# Patient Record
Sex: Female | Born: 1965 | Race: White | Hispanic: No | Smoking: Current every day smoker
Health system: Southern US, Community
[De-identification: ages and names within clinical notes are randomized; demographics above are authoritative.]

## PROBLEM LIST (undated history)

## (undated) DIAGNOSIS — F1010 Alcohol abuse, uncomplicated: Secondary | ICD-10-CM

## (undated) DIAGNOSIS — J45909 Unspecified asthma, uncomplicated: Secondary | ICD-10-CM

## (undated) DIAGNOSIS — M1990 Unspecified osteoarthritis, unspecified site: Secondary | ICD-10-CM

## (undated) DIAGNOSIS — M419 Scoliosis, unspecified: Secondary | ICD-10-CM

## (undated) DIAGNOSIS — I499 Cardiac arrhythmia, unspecified: Secondary | ICD-10-CM

## (undated) DIAGNOSIS — K5900 Constipation, unspecified: Secondary | ICD-10-CM

## (undated) DIAGNOSIS — D649 Anemia, unspecified: Secondary | ICD-10-CM

## (undated) DIAGNOSIS — F419 Anxiety disorder, unspecified: Secondary | ICD-10-CM

## (undated) DIAGNOSIS — M2550 Pain in unspecified joint: Secondary | ICD-10-CM

## (undated) DIAGNOSIS — M47816 Spondylosis without myelopathy or radiculopathy, lumbar region: Secondary | ICD-10-CM

## (undated) DIAGNOSIS — F4310 Post-traumatic stress disorder, unspecified: Secondary | ICD-10-CM

## (undated) DIAGNOSIS — F32A Depression, unspecified: Secondary | ICD-10-CM

## (undated) DIAGNOSIS — N946 Dysmenorrhea, unspecified: Secondary | ICD-10-CM

## (undated) DIAGNOSIS — R200 Anesthesia of skin: Secondary | ICD-10-CM

## (undated) DIAGNOSIS — D219 Benign neoplasm of connective and other soft tissue, unspecified: Secondary | ICD-10-CM

## (undated) DIAGNOSIS — K759 Inflammatory liver disease, unspecified: Secondary | ICD-10-CM

## (undated) DIAGNOSIS — M7071 Other bursitis of hip, right hip: Secondary | ICD-10-CM

## (undated) DIAGNOSIS — N920 Excessive and frequent menstruation with regular cycle: Secondary | ICD-10-CM

## (undated) DIAGNOSIS — M5136 Other intervertebral disc degeneration, lumbar region: Secondary | ICD-10-CM

## (undated) DIAGNOSIS — M7061 Trochanteric bursitis, right hip: Secondary | ICD-10-CM

## (undated) DIAGNOSIS — M51369 Other intervertebral disc degeneration, lumbar region without mention of lumbar back pain or lower extremity pain: Secondary | ICD-10-CM

## (undated) DIAGNOSIS — M810 Age-related osteoporosis without current pathological fracture: Secondary | ICD-10-CM

## (undated) DIAGNOSIS — M797 Fibromyalgia: Secondary | ICD-10-CM

## (undated) HISTORY — PX: CHOLECYSTECTOMY: SHX55

## (undated) HISTORY — DX: Other intervertebral disc degeneration, lumbar region: M51.36

## (undated) HISTORY — PX: NOSE SURGERY: SHX723

## (undated) HISTORY — PX: ULNAR COLLATERAL LIGAMENT RECONSTRUCTION: SHX2593

## (undated) HISTORY — PX: BREAST BIOPSY: SHX20

## (undated) HISTORY — PX: TUBAL LIGATION: SHX77

## (undated) HISTORY — PX: KNEE SURGERY: SHX244

## (undated) HISTORY — PX: TONSILLECTOMY: SUR1361

---

## 2003-08-31 ENCOUNTER — Encounter: Admission: RE | Admit: 2003-08-31 | Discharge: 2003-08-31 | Payer: Self-pay | Admitting: Unknown Physician Specialty

## 2003-08-31 ENCOUNTER — Encounter: Payer: Self-pay | Admitting: Unknown Physician Specialty

## 2007-08-10 ENCOUNTER — Encounter: Payer: Self-pay | Admitting: Obstetrics & Gynecology

## 2007-08-10 ENCOUNTER — Ambulatory Visit: Payer: Self-pay | Admitting: Family Medicine

## 2007-08-11 ENCOUNTER — Encounter: Admission: RE | Admit: 2007-08-11 | Discharge: 2007-08-11 | Payer: Self-pay | Admitting: Family Medicine

## 2007-11-11 HISTORY — PX: ULNAR COLLATERAL LIGAMENT RECONSTRUCTION: SHX2593

## 2007-11-20 ENCOUNTER — Emergency Department: Payer: Self-pay | Admitting: Emergency Medicine

## 2007-12-28 ENCOUNTER — Ambulatory Visit: Payer: Self-pay | Admitting: Unknown Physician Specialty

## 2008-01-09 ENCOUNTER — Ambulatory Visit: Payer: Self-pay | Admitting: Unknown Physician Specialty

## 2008-02-09 ENCOUNTER — Ambulatory Visit: Payer: Self-pay | Admitting: Unknown Physician Specialty

## 2008-03-10 ENCOUNTER — Ambulatory Visit: Payer: Self-pay | Admitting: Unknown Physician Specialty

## 2008-09-18 ENCOUNTER — Encounter: Admission: RE | Admit: 2008-09-18 | Discharge: 2008-09-18 | Payer: Self-pay | Admitting: Family Medicine

## 2008-10-02 ENCOUNTER — Encounter: Admission: RE | Admit: 2008-10-02 | Discharge: 2008-10-02 | Payer: Self-pay | Admitting: Family Medicine

## 2008-10-03 ENCOUNTER — Encounter: Admission: RE | Admit: 2008-10-03 | Discharge: 2008-10-03 | Payer: Self-pay | Admitting: Family Medicine

## 2008-10-03 ENCOUNTER — Encounter (INDEPENDENT_AMBULATORY_CARE_PROVIDER_SITE_OTHER): Payer: Self-pay | Admitting: Diagnostic Radiology

## 2008-11-27 ENCOUNTER — Encounter: Payer: Self-pay | Admitting: Obstetrics and Gynecology

## 2008-11-27 ENCOUNTER — Ambulatory Visit: Payer: Self-pay | Admitting: Obstetrics and Gynecology

## 2010-01-15 ENCOUNTER — Ambulatory Visit: Payer: Self-pay | Admitting: Obstetrics & Gynecology

## 2010-01-21 ENCOUNTER — Encounter: Admission: RE | Admit: 2010-01-21 | Discharge: 2010-01-21 | Payer: Self-pay | Admitting: Obstetrics & Gynecology

## 2010-08-07 ENCOUNTER — Ambulatory Visit: Payer: Self-pay | Admitting: Family Medicine

## 2010-10-10 ENCOUNTER — Ambulatory Visit: Payer: Self-pay | Admitting: Gastroenterology

## 2010-11-08 ENCOUNTER — Ambulatory Visit: Payer: Self-pay | Admitting: Surgery

## 2010-11-15 ENCOUNTER — Ambulatory Visit: Payer: Self-pay | Admitting: Surgery

## 2011-03-25 NOTE — Assessment & Plan Note (Signed)
NAME:  Julia Hoover, KUGEL NO.:  [id number]   MEDICAL RECORD NO.:  [id number]          PATIENT TYPE:  POB   LOCATION:  [facility] at [facility]         FACILITY:  [facility]   PHYSICIAN:  Argentina Donovan, MD        DATE OF BIRTH:  01-29-1966   DATE OF SERVICE:  11/27/2008                                  CLINIC NOTE   The patient is a 45 year old Caucasian female who has been a long-term  smoker, but is in fairly good health.  She has had severe problems with  PMS recently and her periods have become somewhat erratic.  We told her  that we could not oral contraceptives to control the periods and that if  they continued getting worse further on with her perimenopausal  symptoms, she may want to consider endometrial ablation.  We discussed  the PMS for some time, seems to have the symptoms for about 5 days prior  to bleeding episode.  We are going to start on 20 mg of Prozac a day and  see how that works for her.  She also recently remarried a man who is  about 4 years junior and has no children and was thinking about it.  She  has had a previous tubal ligation.  I have told her that almost no one  does the tubal reversals any more and at her age, she may even need a  donor even she wants for in vitro fertilization.  If she is interested,  we will refer her to a consult for IVF in the future.  She is going to  talk to her husband about that.   PHYSICAL EXAMINATION:  ENDOCRINE:  Thyroid is symmetrical.  No masses.  BREASTS:  Symmetrical.  No dominant masses.  She has had her recent  mammogram.  No nipple discharge.  No supraclavicular or axillary nodes.  ABDOMEN:  Soft, nontender.  No masses or organomegaly.  PELVIC:  Genitalia, external is normal.  BUS within normal limits.  Vagina is clean and well rugated.  Cervix is clean, parous, and far  posterior.  Uterus is anterior.  Normal in size, shape, and consistency.  The adnexa is normal.   IMPRESSION:  Normal gynecological  examination, postmenopausal syndrome,  and recent menstrual irregularity.  She has a history of depression in  the past.           ______________________________  Argentina Donovan, MD     PR/MEDQ  D:  11/27/2008  T:  11/28/2008  Job:  [id number]

## 2011-03-25 NOTE — Assessment & Plan Note (Signed)
NAME:  Julia Hoover, Julia Hoover               ACCOUNT NO.:  [id number]   MEDICAL RECORD NO.:  [id number]          PATIENT TYPE:  POB   LOCATION:  [facility]         FACILITY:  [facility]   PHYSICIAN:  Scheryl Darter, MD       DATE OF BIRTH:  1966-05-12   DATE OF SERVICE:                                  CLINIC NOTE   The patient comes today for physical exam.  She has had breast pain for  about the last 4 weeks on the left side.  No nipple discharge or any  skin changes.  She has had a breast cyst on the right, which was  treated.  Last menstrual period was February 2011.  She has regular  menses.  She is status post tubal ligation.   PAST MEDICAL HISTORY:  1. Breast cyst.  2. Previous history of alcohol abuse.  3. Degenerative disk disease.   FAMILY HISTORY:  Hypertension, thyroid disorder, benign breast cyst, and  arthritis.   SOCIAL HISTORY:  The patient is married and smokes 1 pack of cigarettes  a day.  She denies alcohol use or drug use.   PAST SURGICAL HISTORY:  Tubal ligation.   MEDICATIONS:  Tramadol 50 mg one half tablets a day.   REVIEW OF SYSTEMS:  Breast pain as noted.  No problems with bowel,  bladder, or menstrual complaints.   PHYSICAL EXAMINATION:  GENERAL:  No acute distress.  VITAL SIGNS:  Weight 128 pounds, height 5 feet 2 inches, blood pressure  114/65, pulse 66.  CHEST:  Clear.  HEART:  Regular rate and rhythm.  No thyromegaly.  BREASTS:  Appear symmetric.  No nipple or skin changes.  No axillary  lymphadenopathy.  Right breast is without masses or tenderness.  Left  breast is tender in the medial lower quadrant, but no discrete mass.  ABDOMEN:  Flat, soft, and nontender.  EXTREMITIES:  No swelling.  PELVIC:  External genitalia, vagina, and cervix appeared normal.  Pap  was done.  Uterus normal size.  No adnexal masses or tenderness.   IMPRESSION:  Left breast pain.   PLAN:  Make a referral for diagnostic mammogram and ultrasound as  needed.  She will  return in 1 month to review her progress.  We  recommended smoking cessation.  She will speak to her primary care  physician about her back pain and whether she needs to see  rheumatologist.      Scheryl Darter, MD     JA/MEDQ  D:  01/15/2010  T:  01/16/2010  Job:  [id number]

## 2011-03-25 NOTE — Assessment & Plan Note (Signed)
NAME:  DOCIE, ABRAMOVICH             ACCOUNT NO.:  [id number]   MEDICAL RECORD NO.:  [id number]          PATIENT TYPE:  POB   LOCATION:  [facility]         FACILITY:  [facility]   PHYSICIAN:  Tinnie Gens, MD        DATE OF BIRTH:  05-07-1966   DATE OF SERVICE:                                  CLINIC NOTE   Julia Hoover is a 45 year old white female who presents today for her GYN  exam.   PAST MEDICAL HISTORY:  Significant for hepatitis as a child.  Treatment,  she states, was bedrest for 6 weeks.  She does not know whether it is A  or B.  Joint pain x10 years which is being followed by her medical  doctor in [place], Dr. Rulon Abide, who has referred her to a  rheumatologist at the [facility].  Her appointment is August 31, 2007.   FAMILY HISTORY:  Significant her mother has hypertension, thyroid  disorder, tendinitis, and arthritis.  Her brother has polychondritis.   SOCIAL HISTORY:  Ms. Mira smokes 1 pack a day and has for many years.  She has recently stopped drinking alcohol and denies recreational drug  use.  She works in an [employer] and is required to do heavy  lifting.   GYN HISTORY:  Denies abnormal genital cytology.  She is gravida 3, AB 1,  para 2.  Her children are now ages 59 and 58 years old.  She is sexually  active with one partner.   REVIEW OF SYSTEMS:  Significant for joint pain, which has been treated  with cortisone injections.  She also states that she has had headaches  for 1 month lasting about 2 days.  Workup for these headaches is planned  with her medical doctor.  She reports extreme breast tenderness and mood  swings 1 week prior to her period.  These symptoms are completely  resolved with the onset of her cycle.  She denies breast, urinary tract,  or vaginal symptoms today.   ALLERGIES:  No known drug allergies.  It is noted that she has a LATEX  allergy.   The first day of her last period was August 01, 2007.   CURRENT  MEDICATIONS:  1. Seroquel 50 mg at bedtime.  2. Tramadol 50 mg daily.  3. Occasionally she will use ibuprofen 800 mg for joint pain.   PHYSICAL EXAM:  VITAL SIGNS:  Pulse 66, blood pressure 129/72, weight  135.  Height is 5 feet 2 inches.  Head, ears, nose, nose and throat without thyroid enlargement.  BREASTS:  Bilaterally soft without masses, nodes, or nipple discharge.  Heart rate and rhythm are regular without murmur, gallop or cardiac  enlargement.  LUNGS:  Bilaterally clear.  ABDOMEN:  Soft and scaphoid without masses or organomegaly.  PELVIC EXAM:  External genitalia within normal limits for female.  Vagina is clean and rugose.  Cervix is parous and clean.  The uterus is  normal shape, size, and contour without tenderness.  Adnexa bilaterally  clear.  Rectovaginal exam confirms.  EXTREMITIES:  Within normal limits with the exception of tenderness over  the joints, especially her elbow.   ASSESSMENT:  1. Normal gynecologic exam.  2. Premenstrual syndrome.  3. Joint pain followed by her medical doctor.   PLAN:  1. Mammogram was scheduled with reports being sent to [place].  2. Pap smear was taken today.  Results 1-2 weeks.  3. The patient plans to stop smoking and may need some help with      anxiety related to that.  She has taken Xanax in the past and may      call for a new prescription at that time.  If she needs Xanax more      often than we feel is safe, we will consider changing her to      Wellbutrin.  4. She was encouraged to do self-breast exams monthly.  She will have      general health labs done with Dr. Rulon Abide.     ______________________________  Matt Holmes, N.P.    ______________________________  Tinnie Gens, MD    EMK/MEDQ  D:  08/10/2007  T:  08/10/2007  Job:  [id number]

## 2011-04-24 ENCOUNTER — Other Ambulatory Visit: Payer: Self-pay | Admitting: Family Medicine

## 2011-04-24 DIAGNOSIS — Z1231 Encounter for screening mammogram for malignant neoplasm of breast: Secondary | ICD-10-CM

## 2011-05-06 ENCOUNTER — Ambulatory Visit: Payer: Self-pay

## 2011-11-11 HISTORY — PX: BREAST CYST ASPIRATION: SHX578

## 2012-06-03 ENCOUNTER — Ambulatory Visit: Payer: Self-pay | Admitting: Family Medicine

## 2014-03-20 ENCOUNTER — Ambulatory Visit: Payer: Self-pay | Admitting: Orthopedic Surgery

## 2014-03-31 ENCOUNTER — Ambulatory Visit (INDEPENDENT_AMBULATORY_CARE_PROVIDER_SITE_OTHER): Payer: BC Managed Care – PPO | Admitting: Podiatry

## 2014-03-31 ENCOUNTER — Ambulatory Visit (INDEPENDENT_AMBULATORY_CARE_PROVIDER_SITE_OTHER): Payer: BC Managed Care – PPO

## 2014-03-31 VITALS — BP 114/68 | HR 69 | Resp 16

## 2014-03-31 DIAGNOSIS — M779 Enthesopathy, unspecified: Secondary | ICD-10-CM

## 2014-03-31 DIAGNOSIS — M2040 Other hammer toe(s) (acquired), unspecified foot: Secondary | ICD-10-CM

## 2014-03-31 MED ORDER — TRIAMCINOLONE ACETONIDE 10 MG/ML IJ SUSP
10.0000 mg | Freq: Once | INTRAMUSCULAR | Status: AC
  Administered 2014-03-31: 10 mg

## 2014-03-31 NOTE — Progress Notes (Signed)
Subjective:     Patient ID: Julia Hoover, female   DOB: [birth year], 48 y.o.   MRN: [id number]  HPI patient presents stating that her left foot has started to do the same as her right foot and the toe is starting to drift on that foot and she's getting pain  Review of Systems     Objective:   Physical Exam Neurovascular status intact with no health history changes noted and discomfort in the second metatarsal phalangeal joint with medial drift of the second toe left. The right remains drift and also towards the big toe and there is mild discomfort in that 1    Assessment:     Probable flexor plate stretch creating inflammation with capsulitis occurring which first occurred in the right now the left    Plan:     Reviewed condition and x-ray with patient. I went ahead and a proximal nerve block and then aspirated the joint and was able to get up a small amount of clear fluid and injected with a quarter cc of dexamethasone Kenalog combination. Explained the condition and hammertoe and that someday the digit may need to be corrected with metatarsal osteotomy. Today we did scanned for orthotics which she will pick up in several weeks

## 2014-04-02 ENCOUNTER — Ambulatory Visit: Payer: Self-pay

## 2014-04-02 LAB — CBC WITH DIFFERENTIAL/PLATELET
BASOS PCT: 0.6 %
Basophil #: 0 10*3/uL (ref 0.0–0.1)
EOS PCT: 1.6 %
Eosinophil #: 0.1 10*3/uL (ref 0.0–0.7)
HCT: 39.9 % (ref 35.0–47.0)
HGB: 13.1 g/dL (ref 12.0–16.0)
LYMPHS PCT: 31.5 %
Lymphocyte #: 2.3 10*3/uL (ref 1.0–3.6)
MCH: 30.5 pg (ref 26.0–34.0)
MCHC: 32.8 g/dL (ref 32.0–36.0)
MCV: 93 fL (ref 80–100)
Monocyte #: 0.5 x10 3/mm (ref 0.2–0.9)
Monocyte %: 6.8 %
Neutrophil #: 4.4 10*3/uL (ref 1.4–6.5)
Neutrophil %: 59.5 %
Platelet: 248 10*3/uL (ref 150–440)
RBC: 4.29 10*6/uL (ref 3.80–5.20)
RDW: 13.7 % (ref 11.5–14.5)
WBC: 7.4 10*3/uL (ref 3.6–11.0)

## 2014-04-12 ENCOUNTER — Encounter: Payer: Self-pay | Admitting: *Deleted

## 2014-04-12 NOTE — Progress Notes (Signed)
Sent pt post card letting her know orthotics are here. 

## 2014-04-21 ENCOUNTER — Ambulatory Visit (INDEPENDENT_AMBULATORY_CARE_PROVIDER_SITE_OTHER): Payer: BC Managed Care – PPO | Admitting: Podiatry

## 2014-04-21 VITALS — BP 115/80 | HR 66 | Resp 16

## 2014-04-21 DIAGNOSIS — M779 Enthesopathy, unspecified: Secondary | ICD-10-CM

## 2014-04-21 DIAGNOSIS — B351 Tinea unguium: Secondary | ICD-10-CM

## 2014-04-21 NOTE — Progress Notes (Signed)
Subjective:     Patient ID: Julia Hoover, female   DOB: 1966/06/30, 48 y.o.   MRN: [id number]  HPI patient was concerned about fungus on her right nails and also does have mild discomfort still under her feet   Review of Systems     Objective:   Physical Exam Neurovascular status unchanged with slight discoloration of the hallux nail and second nail right with mild discomfort plantar feet    Assessment:     Mild mycotic nail infection along with low grade inflammation of the lesser MPJs    Plan:     Reviewed condition and topical treatments and begin orthotic usage with instructions given today

## 2014-04-21 NOTE — Patient Instructions (Signed)

## 2014-06-02 ENCOUNTER — Ambulatory Visit (INDEPENDENT_AMBULATORY_CARE_PROVIDER_SITE_OTHER): Payer: BC Managed Care – PPO | Admitting: Podiatry

## 2014-06-02 ENCOUNTER — Ambulatory Visit (INDEPENDENT_AMBULATORY_CARE_PROVIDER_SITE_OTHER): Payer: BC Managed Care – PPO

## 2014-06-02 ENCOUNTER — Encounter: Payer: Self-pay | Admitting: Podiatry

## 2014-06-02 VITALS — Ht 62.0 in | Wt 130.0 lb

## 2014-06-02 DIAGNOSIS — M722 Plantar fascial fibromatosis: Secondary | ICD-10-CM

## 2014-06-02 DIAGNOSIS — M779 Enthesopathy, unspecified: Secondary | ICD-10-CM

## 2014-06-02 DIAGNOSIS — M2040 Other hammer toe(s) (acquired), unspecified foot: Secondary | ICD-10-CM

## 2014-06-02 MED ORDER — DICLOFENAC SODIUM 75 MG PO TBEC: 75.0000 mg | DELAYED_RELEASE_TABLET | Freq: Two times a day (BID) | ORAL | Status: DC

## 2014-06-03 NOTE — Progress Notes (Signed)
Subjective:     Patient ID: Julia Hoover, female   DOB: 12-04-1965, 48 y.o.   MRN: [id number]  HPI patient states that her heels are hurting again but that the worse pain is in the left second metatarsophalangeal joint with possible further movement of the toe and inability to walk on that side of her foot. States that she knows someday she might have did have something done with this but she is depressed at the current time and would like to try to hold off on doing some thing   Review of Systems     Objective:   Physical Exam Neurovascular status was intact with inflammation and pain second MPJ left foot and mild discomfort in the plantar heels of both feet    Assessment:     Probable flexor plate stretch of the left second MPJ with chronic capsulitis made worse by working on cement floors    Plan:     H&P reviewed and condition discussed. I went ahead today and I applied air fracture walker left to provide rigidity to the area and dispensed graphite insole. We may need to consider surgery and will decide in 1 month and I also placed her on diclofenac 75 mg twice a day

## 2014-06-30 ENCOUNTER — Ambulatory Visit: Payer: BC Managed Care – PPO | Admitting: Podiatry

## 2014-08-26 ENCOUNTER — Ambulatory Visit: Payer: Self-pay

## 2014-12-27 ENCOUNTER — Ambulatory Visit: Payer: Self-pay | Admitting: Emergency Medicine

## 2015-04-19 ENCOUNTER — Ambulatory Visit
Admission: EM | Admit: 2015-04-19 | Discharge: 2015-04-19 | Disposition: A | Discharge status: Home / Self Care | Payer: BLUE CROSS/BLUE SHIELD | Attending: Family Medicine | Admitting: Family Medicine

## 2015-04-19 DIAGNOSIS — F419 Anxiety disorder, unspecified: Secondary | ICD-10-CM | POA: Diagnosis not present

## 2015-04-19 HISTORY — DX: Unspecified osteoarthritis, unspecified site: M19.90

## 2015-04-19 HISTORY — DX: Spondylosis without myelopathy or radiculopathy, lumbar region: M47.816

## 2015-04-19 HISTORY — DX: Fibromyalgia: M79.7

## 2015-04-19 HISTORY — DX: Anxiety disorder, unspecified: F41.9

## 2015-04-19 MED ORDER — CLONAZEPAM 1 MG PO TABS: ORAL_TABLET | ORAL | Status: DC

## 2015-04-19 NOTE — ED Provider Notes (Signed)
CSN: [id number]     Arrival date & time 04/19/15  1833 History   First MD Initiated Contact with Patient 04/19/15 1906     Chief Complaint  Patient presents with  . Panic Attack   (Consider location/radiation/quality/duration/timing/severity/associated sxs/prior Treatment) HPI Comments: 49 yo female with a h/o anxiety presents with a h/o panic attack associated with heart racing, sweaty palms, flushed. Happened at work around 5:30.   The history is provided by the patient.    Past Medical History  Diagnosis Date  . Arthritis   . Fibromyalgia   . Anxiety   . Degenerative joint disease (DJD) of lumbar spine    Past Surgical History  Procedure Laterality Date  . Ulnar collateral ligament reconstruction    . Tonsillectomy    . Tubal ligation    . Cholecystectomy    . Nose surgery     No family history on file. History  Substance Use Topics  . Smoking status: Former Smoker -- 1.00 packs/day for 32 years    Types: Cigarettes    Quit date: 11/09/2012  . Smokeless tobacco: Never Used  . Alcohol Use: No     Comment: former alcohol use   OB History    No data available     Review of Systems  Allergies  Review of patient's allergies indicates no known allergies.  Home Medications   Prior to Admission medications   Medication Sig Start Date End Date Taking? Authorizing Provider  methocarbamol (ROBAXIN) 500 MG tablet Take 500 mg by mouth 2 (two) times daily.   Yes Historical Provider, MD  traMADol (ULTRAM) 50 MG tablet Take by mouth every 6 (six) hours as needed.   Yes Historical Provider, MD  traZODone (DESYREL) 50 MG tablet Take 25 mg by mouth at bedtime.   Yes Historical Provider, MD  zaleplon (SONATA) 10 MG capsule Take 10 mg by mouth at bedtime as needed for sleep.   Yes Historical Provider, MD  clonazePAM (KLONOPIN) 1 MG tablet Take half to one tablet po qd prn 04/19/15   Norval Gable, MD  diclofenac (VOLTAREN) 75 MG EC tablet Take 1 tablet (75 mg total) by mouth 2  (two) times daily. 06/02/14   Wallene Huh, DPM   BP 113/69 mmHg  Pulse 97  Temp(Src) 97.8 F (36.6 C) (Oral)  Resp 16  Ht 5\' 2"  (1.575 m)  Wt 137 lb (62.143 kg)  BMI 25.05 kg/m2  SpO2 98%  LMP 03/22/2015 (Exact Date) Physical Exam  Constitutional: She is oriented to person, place, and time. She appears well-developed and well-nourished. No distress.  Pulmonary/Chest: Effort normal. No respiratory distress.  Neurological: She is alert and oriented to person, place, and time.  Skin: She is not diaphoretic.  Psychiatric: She has a normal mood and affect. Her behavior is normal. Judgment and thought content normal.    ED Course  Procedures (including critical care time) Labs Review Labs Reviewed - No data to display  Imaging Review No results found.   MDM   1. Anxiety    Discharge Medication List as of 04/19/2015  7:32 PM    START taking these medications   Details  clonazePAM (KLONOPIN) 1 MG tablet Take half to one tablet po qd prn, Print      Plan: 1. Diagnosis reviewed with patient 2. rx as per orders; risks, benefits, potential side effects reviewed with patient 3. Recommend follow up with PCP 4. F/u prn if symptoms worsen or don't improve    Julia Hoover  Zenda Alpers, MD 04/19/15 2011

## 2015-04-19 NOTE — ED Notes (Signed)
Pt reports she had a panic attack at work today around 5:30. Pt states she cried, had a racing heart, sweaty palms, flushed feeling. Pt states she had to leave work. Pt reports that her anxiety has been worsening now that she is menopausal. Pt has been on Klonopin for this in the past, about 2 years ago.

## 2015-06-12 ENCOUNTER — Ambulatory Visit
Admission: EM | Admit: 2015-06-12 | Discharge: 2015-06-12 | Disposition: A | Discharge status: Home / Self Care | Payer: BLUE CROSS/BLUE SHIELD | Attending: Family Medicine | Admitting: Family Medicine

## 2015-06-12 DIAGNOSIS — R197 Diarrhea, unspecified: Secondary | ICD-10-CM

## 2015-06-12 DIAGNOSIS — M797 Fibromyalgia: Secondary | ICD-10-CM | POA: Diagnosis not present

## 2015-06-12 DIAGNOSIS — F419 Anxiety disorder, unspecified: Secondary | ICD-10-CM | POA: Insufficient documentation

## 2015-06-12 DIAGNOSIS — K921 Melena: Secondary | ICD-10-CM | POA: Insufficient documentation

## 2015-06-12 DIAGNOSIS — Z87891 Personal history of nicotine dependence: Secondary | ICD-10-CM | POA: Insufficient documentation

## 2015-06-12 DIAGNOSIS — Z79899 Other long term (current) drug therapy: Secondary | ICD-10-CM | POA: Diagnosis not present

## 2015-06-12 DIAGNOSIS — M47896 Other spondylosis, lumbar region: Secondary | ICD-10-CM | POA: Insufficient documentation

## 2015-06-12 HISTORY — DX: Dysmenorrhea, unspecified: N94.6

## 2015-06-12 HISTORY — DX: Benign neoplasm of connective and other soft tissue, unspecified: D21.9

## 2015-06-12 HISTORY — DX: Excessive and frequent menstruation with regular cycle: N92.0

## 2015-06-12 LAB — CBC WITH DIFFERENTIAL/PLATELET
BASOS PCT: 1 %
Basophils Absolute: 0 10*3/uL (ref 0–0.1)
EOS ABS: 0 10*3/uL (ref 0–0.7)
EOS PCT: 0 %
HEMATOCRIT: 35.2 % (ref 35.0–47.0)
HEMOGLOBIN: 11.7 g/dL — AB (ref 12.0–16.0)
LYMPHS PCT: 24 %
Lymphs Abs: 1.4 10*3/uL (ref 1.0–3.6)
MCH: 29.9 pg (ref 26.0–34.0)
MCHC: 33.3 g/dL (ref 32.0–36.0)
MCV: 90 fL (ref 80.0–100.0)
MONOS PCT: 7 %
Monocytes Absolute: 0.4 10*3/uL (ref 0.2–0.9)
NEUTROS PCT: 68 %
Neutro Abs: 4 10*3/uL (ref 1.4–6.5)
PLATELETS: 220 10*3/uL (ref 150–440)
RBC: 3.91 MIL/uL (ref 3.80–5.20)
RDW: 13.9 % (ref 11.5–14.5)
WBC: 5.8 10*3/uL (ref 3.6–11.0)

## 2015-06-12 LAB — COMPREHENSIVE METABOLIC PANEL
ALK PHOS: 66 U/L (ref 38–126)
ALT: 16 U/L (ref 14–54)
ANION GAP: 6 (ref 5–15)
AST: 24 U/L (ref 15–41)
Albumin: 4 g/dL (ref 3.5–5.0)
BUN: 13 mg/dL (ref 6–20)
CALCIUM: 9.1 mg/dL (ref 8.9–10.3)
CHLORIDE: 106 mmol/L (ref 101–111)
CO2: 26 mmol/L (ref 22–32)
CREATININE: 0.74 mg/dL (ref 0.44–1.00)
GFR calc Af Amer: >60 mL/min (ref >60)
GLUCOSE: 105 mg/dL — AB (ref 65–99)
Potassium: 3.9 mmol/L (ref 3.5–5.1)
Sodium: 138 mmol/L (ref 135–145)
TOTAL PROTEIN: 6.8 g/dL (ref 6.5–8.1)
Total Bilirubin: 0.2 mg/dL — ABNORMAL LOW (ref 0.3–1.2)

## 2015-06-12 LAB — OCCULT BLOOD X 1 CARD TO LAB, STOOL: Fecal Occult Bld: NEGATIVE

## 2015-06-12 NOTE — Discharge Instructions (Signed)
Please schedule your colonoscopy ! Please reduce your sodas and increase your water ... Daily prunes :)  High-Fiber Diet Fiber is found in fruits, vegetables, and grains. A high-fiber diet encourages the addition of more whole grains, legumes, fruits, and vegetables in your diet. The recommended amount of fiber for adult males is 38 g per day. For adult females, it is 25 g per day. Pregnant and lactating women should get 28 g of fiber per day. If you have a digestive or bowel problem, ask your caregiver for advice before adding high-fiber foods to your diet. Eat a variety of high-fiber foods instead of only a select few type of foods.  PURPOSE  To increase stool bulk.  To make bowel movements more regular to prevent constipation.  To lower cholesterol.  To prevent overeating. WHEN IS THIS DIET USED?  It may be used if you have constipation and hemorrhoids.  It may be used if you have uncomplicated diverticulosis (intestine condition) and irritable bowel syndrome.  It may be used if you need help with weight management.  It may be used if you want to add it to your diet as a protective measure against atherosclerosis, diabetes, and cancer. SOURCES OF FIBER  Whole-grain breads and cereals.  Fruits, such as apples, oranges, bananas, berries, prunes, and pears.  Vegetables, such as green peas, carrots, sweet potatoes, beets, broccoli, cabbage, spinach, and artichokes.  Legumes, such split peas, soy, lentils.  Almonds. FIBER CONTENT IN FOODS Starches and Grains / Dietary Fiber (g)  Cheerios, 1 cup / 3 g  Corn Flakes cereal, 1 cup / 0.7 g  Rice crispy treat cereal, 1 cup / 0.3 g  Instant oatmeal (cooked),  cup / 2 g  Frosted wheat cereal, 1 cup / 5.1 g  Brown, long-grain rice (cooked), 1 cup / 3.5 g  White, long-grain rice (cooked), 1 cup / 0.6 g  Enriched macaroni (cooked), 1 cup / 2.5 g Legumes / Dietary Fiber (g)  Baked beans (canned, plain, or vegetarian),   cup / 5.2 g  Kidney beans (canned),  cup / 6.8 g  Pinto beans (cooked),  cup / 5.5 g Breads and Crackers / Dietary Fiber (g)  Plain or honey graham crackers, 2 squares / 0.7 g  Saltine crackers, 3 squares / 0.3 g  Plain, salted pretzels, 10 pieces / 1.8 g  Whole-wheat bread, 1 slice / 1.9 g  White bread, 1 slice / 0.7 g  Raisin bread, 1 slice / 1.2 g  Plain bagel, 3 oz / 2 g  Flour tortilla, 1 oz / 0.9 g  Corn tortilla, 1 small / 1.5 g  Hamburger or hotdog bun, 1 small / 0.9 g Fruits / Dietary Fiber (g)  Apple with skin, 1 medium / 4.4 g  Sweetened applesauce,  cup / 1.5 g  Banana,  medium / 1.5 g  Grapes, 10 grapes / 0.4 g  Orange, 1 small / 2.3 g  Raisin, 1.5 oz / 1.6 g  Melon, 1 cup / 1.4 g Vegetables / Dietary Fiber (g)  Green beans (canned),  cup / 1.3 g  Carrots (cooked),  cup / 2.3 g  Broccoli (cooked),  cup / 2.8 g  Peas (cooked),  cup / 4.4 g  Mashed potatoes,  cup / 1.6 g  Lettuce, 1 cup / 0.5 g  Corn (canned),  cup / 1.6 g  Tomato,  cup / 1.1 g Document Released: 10/27/2005 Document Revised: 04/27/2012 Document Reviewed: 01/29/2012 Exit[employer] Patient Information 2015  [employer]. This information is not intended to replace advice given to you by your health care provider. Make sure you discuss any questions you have with your health care provider. Iron Deficiency Anemia Anemia is a condition in which there are less red blood cells or hemoglobin in the blood than normal. Hemoglobin is the part of red blood cells that carries oxygen. Iron deficiency anemia is anemia caused by too little iron. It is the most common type of anemia. It may leave you tired and short of breath. CAUSES   Lack of iron in the diet.  Poor absorption of iron, as seen with intestinal disorders.  Intestinal bleeding.  Heavy periods. SIGNS AND SYMPTOMS  Mild anemia may not be noticeable. Symptoms may include:  Fatigue.  Headache.  Pale  skin.  Weakness.  Tiredness.  Shortness of breath.  Dizziness.  Cold hands and feet.  Fast or irregular heartbeat. DIAGNOSIS  Diagnosis requires a thorough evaluation and physical exam by your health care provider. Blood tests are generally used to confirm iron deficiency anemia. Additional tests may be done to find the underlying cause of your anemia. These may include:  Testing for blood in the stool (fecal occult blood test).  A procedure to see inside the colon and rectum (colonoscopy).  A procedure to see inside the esophagus and stomach (endoscopy). TREATMENT  Iron deficiency anemia is treated by correcting the cause of the deficiency. Treatment may involve:  Adding iron-rich foods to your diet.  Taking iron supplements. Pregnant or breastfeeding women need to take extra iron because their normal diet usually does not provide the required amount.  Taking vitamins. Vitamin C improves the absorption of iron. Your health care provider may recommend that you take your iron tablets with a glass of orange juice or vitamin C supplement.  Medicines to make heavy menstrual flow lighter.  Surgery. HOME CARE INSTRUCTIONS   Take iron as directed by your health care provider.  If you cannot tolerate taking iron supplements by mouth, talk to your health care provider about taking them through a vein (intravenously) or an injection into a muscle.  For the best iron absorption, iron supplements should be taken on an empty stomach. If you cannot tolerate them on an empty stomach, you may need to take them with food.  Do not drink milk or take antacids at the same time as your iron supplements. Milk and antacids may interfere with the absorption of iron.  Iron supplements can cause constipation. Make sure to include fiber in your diet to prevent constipation. A stool softener may also be recommended.  Take vitamins as directed by your health care provider.  Eat a diet rich in iron.  Foods high in iron include liver, lean beef, whole-grain bread, eggs, dried fruit, and dark green leafy vegetables. SEEK IMMEDIATE MEDICAL CARE IF:   You faint. If this happens, do not drive. Call your local emergency services (911 in [place] if no other help is available.  You have chest pain.  You feel nauseous or vomit.  You have severe or increased shortness of breath with activity.  You feel weak.  You have a rapid heartbeat.  You have unexplained sweating.  You become light-headed when getting up from a chair or bed. MAKE SURE YOU:   Understand these instructions.  Will watch your condition.  Will get help right away if you are not doing well or get worse. Document Released: 10/24/2000 Document Revised: 11/01/2013 Document Reviewed: 07/04/2013 ExitCare Patient Information  2015 [employer]. This information is not intended to replace advice given to you by your health care provider. Make sure you discuss any questions you have with your health care provider.

## 2015-06-12 NOTE — ED Provider Notes (Signed)
CSN: [id number]     Arrival date & time 06/12/15  1520 History   First MD Initiated Contact with Patient 06/12/15 1556     Chief Complaint  Patient presents with  . Blood In Stools   (Consider location/radiation/quality/duration/timing/severity/associated sxs/prior Treatment) HPI 49 yo F reports episode of diarrhea 3 weeks ago after visiting [place] , ceased spontaneously. Spent 4th of July at the beach, 3 of the family children had Hand, Foot, Mouth-but she did not contract.  Spent this past weekend at the beach - reports heavy ETOH intake- Had 3 episodes of diarrhea during last night and  this morning. Believes that it "wasn't stool but was bright red blood' Began her menses in last 2 days but is sure she wasn't seeing menstrual blood in toilet bowl. Never had coffee ground emesis. Convinced she has "hemorrhoids or a fissure or bowel bleed".  Commonly constipated ( since I was 20)and uses laxatives and enemas. Loves prunes but forgets to eat them Reports known diverticulosis, has had an episode of "itis" and was counseled to schedule a colonoscopy but has delayed. Had a BTL at age 32. Has had increasingly heavy menses for years,fibroids and pelvic pain.  Has not had a Gyn visit in years.  Past Medical History  Diagnosis Date  . Arthritis   . Fibromyalgia   . Anxiety   . Degenerative joint disease (DJD) of lumbar spine   . Fibroids   . Menorrhagia   . Dysmenorrhea    Past Surgical History  Procedure Laterality Date  . Ulnar collateral ligament reconstruction    . Tonsillectomy    . Tubal ligation    . Cholecystectomy    . Nose surgery     History reviewed. No pertinent family history. History  Substance Use Topics  . Smoking status: Former Smoker -- 1.00 packs/day for 32 years    Types: Cigarettes    Quit date: 11/09/2012  . Smokeless tobacco: Never Used  . Alcohol Use: 0.0 oz/week    0 Standard drinks or equivalent per week     Comment: occasional weekday; party  weekends   OB History    No data available     Review of Systems Constitutional: No fever.  Eyes: No visual changes. ENT:No sore throat. Cardiovascular:Negative for chest pain/palpitations Respiratory: Negative for shortness of breath Gastrointestinal: No abdominal pain. No nausea,vomiting. Diarrhea per HPI  Genitourinary: Negative for dysuria. Normal urination. Musculoskeletal: Negative for back pain. FROM extremities without pain Skin: Negative for rash Neurological: Negative for headache, focal weakness or numbness Allergies  Review of patient's allergies indicates no known allergies.  Home Medications   Prior to Admission medications   Medication Sig Start Date End Date Taking? Authorizing Provider  clonazePAM (KLONOPIN) 1 MG tablet Take half to one tablet po qd prn 04/19/15  Yes Norval Gable, MD  escitalopram (LEXAPRO) 20 MG tablet Take 20 mg by mouth daily.   Yes Historical Provider, MD  Melatonin 1 MG CAPS Take by mouth.   Yes Historical Provider, MD  methocarbamol (ROBAXIN) 500 MG tablet Take 500 mg by mouth 2 (two) times daily.   Yes Historical Provider, MD  traMADol (ULTRAM) 50 MG tablet Take by mouth every 6 (six) hours as needed.   Yes Historical Provider, MD  traZODone (DESYREL) 50 MG tablet Take 25 mg by mouth at bedtime.   Yes Historical Provider, MD  diclofenac (VOLTAREN) 75 MG EC tablet Take 1 tablet (75 mg total) by mouth 2 (two) times daily. 06/02/14  Wallene Huh, DPM  zaleplon (SONATA) 10 MG capsule Take 10 mg by mouth at bedtime as needed for sleep.    Historical Provider, MD   BP 120/73 mmHg  Pulse 78  Temp(Src) 97.5 F (36.4 C) (Tympanic)  Resp 18  Ht 5\' 2"  (1.575 m)  Wt 138 lb (62.596 kg)  BMI 25.23 kg/m2  SpO2 100%  LMP 06/12/2015 Physical Exam   Constitutional -alert and oriented,well appearing and in no acute distress, VSS Head-atraumatic, normocephalic Eyes- conjunctiva normal, EOMI ,conjugate gaze Nose- no congestion or  rhinorrhea Mouth/throat- mucous membranes moist , Neck- supple without glandular enlargement CV- regular rate, grossly normal heart sounds,  Resp-no distress, normal respiratory effort,clear to auscultation bilaterally GI- soft,non-tender,no distention Rectal exam- no hemorrhoids, no fissure identified, rectum empty- vaginal  tampon palpated through RV; Guiac negative MSK- no tender, normal ROM, all extremities, ambulatory, self-care Neuro- normal speech and language, no gross focal neurological deficit appreciated, no gait instability, Skin-warm,dry ,intact; no rash noted Psych-mood and affect grossly normal; speech and behavior grossly normal ED Course  Procedures (including critical care time) Labs Review Labs Reviewed  CBC WITH DIFFERENTIAL/PLATELET - Abnormal; Notable for the following:    Hemoglobin 11.7 (*)    All other components within normal limits  COMPREHENSIVE METABOLIC PANEL - Abnormal; Notable for the following:    Glucose, Bld 105 (*)    Total Bilirubin 0.2 (*)    All other components within normal limits  OCCULT BLOOD X 1 CARD TO LAB, STOOL   Results for orders placed or performed during the hospital encounter of 06/12/15  Occult blood card to lab, stool Provider will collect  Result Value Ref Range   Fecal Occult Bld NEGATIVE NEGATIVE  CBC with Differential  Result Value Ref Range   WBC 5.8 3.6 - 11.0 K/uL   RBC 3.91 3.80 - 5.20 MIL/uL   Hemoglobin 11.7 (L) 12.0 - 16.0 g/dL   HCT 35.2 35.0 - 47.0 %   MCV 90.0 80.0 - 100.0 fL   MCH 29.9 26.0 - 34.0 pg   MCHC 33.3 32.0 - 36.0 g/dL   RDW 13.9 11.5 - 14.5 %   Platelets 220 150 - 440 K/uL   Neutrophils Relative % 68 %   Neutro Abs 4.0 1.4 - 6.5 K/uL   Lymphocytes Relative 24 %   Lymphs Abs 1.4 1.0 - 3.6 K/uL   Monocytes Relative 7 %   Monocytes Absolute 0.4 0.2 - 0.9 K/uL   Eosinophils Relative 0 %   Eosinophils Absolute 0.0 0 - 0.7 K/uL   Basophils Relative 1 %   Basophils Absolute 0.0 0 - 0.1 K/uL   Comprehensive metabolic panel  Result Value Ref Range   Sodium 138 135 - 145 mmol/L   Potassium 3.9 3.5 - 5.1 mmol/L   Chloride 106 101 - 111 mmol/L   CO2 26 22 - 32 mmol/L   Glucose, Bld 105 (H) 65 - 99 mg/dL   BUN 13 6 - 20 mg/dL   Creatinine, Ser 0.74 0.44 - 1.00 mg/dL   Calcium 9.1 8.9 - 10.3 mg/dL   Total Protein 6.8 6.5 - 8.1 g/dL   Albumin 4.0 3.5 - 5.0 g/dL   AST 24 15 - 41 U/L   ALT 16 14 - 54 U/L   Alkaline Phosphatase 66 38 - 126 U/L   Total Bilirubin 0.2 (L) 0.3 - 1.2 mg/dL   GFR calc non Af Amer >60 >60 mL/min   GFR calc Af Amer >60 >60  mL/min   Anion gap 6 5 - 15    Imaging Review No results found.  Patient was unable to obtain any stool specimen while present in UC. She is given specimen containers for CDiff, O&P and Culture to return tomorrow MDM   Negative physical and lab findings are reviewed with patient. She is anxious about finding something and about missing something.  Encourage an increased fiber diet, stop sods and add water.  Stool softening with prunes and daily walks encouraged- not to allow multiple days and resultant constipation with is irritating.  Now reports very large single stool last evening followed by the diarrhea episode. Encouraged to call and accomplish her screening colonoscopy Also schedule GYN visit. Her blood count is essentially normal but cycles reported as heavy and uncomfortable-seek consultation. Diagnosis and treatment discussed. Questions fielded, expectations and recommendations reviewed.  Patient expresses understanding. Will return to [facility] with questions, concern or exacerbation.    Jan Fireman, PA-C 06/12/15 1935

## 2015-06-12 NOTE — ED Notes (Signed)
Patient states that she had blood in her stool with 4 bowel movements today. She states that stomach and back pain. States that she is currently on menstrual cycle. She states that she noticed dark blood in stool and then went to bright red.

## 2015-06-14 ENCOUNTER — Ambulatory Visit
Admission: EM | Admit: 2015-06-14 | Discharge: 2015-06-14 | Disposition: A | Discharge status: Home / Self Care | Payer: BLUE CROSS/BLUE SHIELD | Attending: Internal Medicine | Admitting: Internal Medicine

## 2015-06-14 ENCOUNTER — Encounter: Payer: Self-pay | Admitting: Emergency Medicine

## 2015-06-14 DIAGNOSIS — F419 Anxiety disorder, unspecified: Secondary | ICD-10-CM | POA: Insufficient documentation

## 2015-06-14 DIAGNOSIS — M1990 Unspecified osteoarthritis, unspecified site: Secondary | ICD-10-CM | POA: Insufficient documentation

## 2015-06-14 DIAGNOSIS — M797 Fibromyalgia: Secondary | ICD-10-CM | POA: Insufficient documentation

## 2015-06-14 DIAGNOSIS — Z87891 Personal history of nicotine dependence: Secondary | ICD-10-CM | POA: Insufficient documentation

## 2015-06-14 DIAGNOSIS — K625 Hemorrhage of anus and rectum: Secondary | ICD-10-CM | POA: Insufficient documentation

## 2015-06-14 DIAGNOSIS — K5900 Constipation, unspecified: Secondary | ICD-10-CM | POA: Diagnosis not present

## 2015-06-14 DIAGNOSIS — N946 Dysmenorrhea, unspecified: Secondary | ICD-10-CM | POA: Diagnosis not present

## 2015-06-14 DIAGNOSIS — Z8719 Personal history of other diseases of the digestive system: Secondary | ICD-10-CM | POA: Diagnosis not present

## 2015-06-14 DIAGNOSIS — Z79899 Other long term (current) drug therapy: Secondary | ICD-10-CM | POA: Insufficient documentation

## 2015-06-14 DIAGNOSIS — R197 Diarrhea, unspecified: Secondary | ICD-10-CM | POA: Insufficient documentation

## 2015-06-14 LAB — C DIFFICILE QUICK SCREEN W PCR REFLEX
C DIFFICILE (CDIFF) INTERP: NEGATIVE
C Diff antigen: NEGATIVE — AB
C Diff toxin: NEGATIVE — AB

## 2015-06-14 NOTE — ED Provider Notes (Signed)
CSN: [id number]     Arrival date & time 06/14/15  1338 History   First MD Initiated Contact with Patient 06/14/15 1436     Chief Complaint  Patient presents with  . Rectal Bleeding   HPI  49yo lady with constipation hx, recent diarrhea and some rectal bleeding.  Evaluated at [facility] 8/2, recommended to seek followup with GI +/- Gyn for rectal bleeding and hx menometrorrhagia. No significant GI bleed detected 8/2; patient has had some recurrent sx's though and is worried about how to tell if bleeding is significant.  Not dizzy, no stomach upset, no rectal trauma by history, no abd pain.  Brought in stools for study (C diff, O&P, C&S suggested in note 8/2).  Past Medical History  Diagnosis Date  . Arthritis   . Fibromyalgia   . Anxiety   . Degenerative joint disease (DJD) of lumbar spine   . Fibroids   . Menorrhagia   . Dysmenorrhea    Past Surgical History  Procedure Laterality Date  . Ulnar collateral ligament reconstruction    . Tonsillectomy    . Tubal ligation    . Cholecystectomy    . Nose surgery     History reviewed. No pertinent family history. History  Substance Use Topics  . Smoking status: Former Smoker -- 1.00 packs/day for 32 years    Types: Cigarettes    Quit date: 11/09/2012  . Smokeless tobacco: Never Used  . Alcohol Use: 0.0 oz/week    0 Standard drinks or equivalent per week     Comment: occasional weekday; party weekends    Review of Systems  All other systems reviewed and are negative.   Allergies  Latex  Home Medications   Prior to Admission medications   Medication Sig Start Date End Date Taking? Authorizing Provider  escitalopram (LEXAPRO) 20 MG tablet Take 20 mg by mouth daily.   Yes Historical Provider, MD  Melatonin 1 MG CAPS Take by mouth.   Yes Historical Provider, MD  methocarbamol (ROBAXIN) 500 MG tablet Take 500 mg by mouth 2 (two) times daily.   Yes Historical Provider, MD  traMADol (ULTRAM) 50 MG tablet Take by mouth every 6 (six) hours  as needed.   Yes Historical Provider, MD  traZODone (DESYREL) 50 MG tablet Take 25 mg by mouth at bedtime.   Yes Historical Provider, MD  clonazePAM (KLONOPIN) 1 MG tablet Take half to one tablet po qd prn 04/19/15   Norval Gable, MD   BP 107/61 mmHg  Pulse 79  Temp(Src) 97.4 F (36.3 C) (Tympanic)  Resp 16  Ht 5\' 2"  (1.575 m)  Wt 136 lb (61.689 kg)  BMI 24.87 kg/m2  SpO2 100%  LMP 06/12/2015 Physical Exam  Constitutional: She is oriented to person, place, and time. No distress.  Alert, nicely groomed  HENT:  Head: Atraumatic.  Eyes:  Conjugate gaze, no eye redness/drainage  Neck: Neck supple.  Cardiovascular: Normal rate.   Pulmonary/Chest: No respiratory distress.  Abdominal: She exhibits no distension.  Musculoskeletal: Normal range of motion.  No leg swelling  Neurological: She is alert and oriented to person, place, and time.  Skin: Skin is warm and dry. No pallor.  No cyanosis  Nursing note and vitals reviewed.   Orthostatic VS for the past 24 hrs:  BP- Lying Pulse- Lying BP- Sitting Pulse- Sitting BP- Standing at 0 minutes Pulse- Standing at 0 minutes  06/14/15 1510 112/61 mmHg 64 117/65 mmHg 66 112/61 mmHg 74  ED Course  Procedures  Results for orders placed or performed during the hospital encounter of 06/14/15  C difficile quick scan w PCR reflex  Result Value Ref Range   C Diff antigen Negative for C. difficile (A) NEGATIVE   C Diff toxin Negative for C. difficile (A) NEGATIVE   C Diff interpretation Negative for C. difficile    Stool for O&P, C&S, pending.  MDM   1. Constipation, unspecified constipation type   2. History of rectal bleeding   3.      Diarrhea, after constipation relieved  Stool negative for C diff; other studies pending. Followup GI; given Dr Dorothey Baseman office number.  Appt in the next 2-4 weeks is fine, unless dizziness, severe bleeding (not just with BM), abd pain, other/severe sx's occur.    Sherlene Shams, MD 06/15/15  [phone]

## 2015-06-14 NOTE — ED Notes (Signed)
Patient states she "has been having blood in her stool since Monday", today she feels like she is passing "tissue in her stool" and wants to be re-evaluated

## 2015-06-15 ENCOUNTER — Telehealth: Payer: Self-pay | Admitting: Gastroenterology

## 2015-06-15 NOTE — Telephone Encounter (Signed)
Colonoscopy triage °

## 2015-06-18 ENCOUNTER — Other Ambulatory Visit: Payer: Self-pay

## 2015-06-18 ENCOUNTER — Telehealth: Payer: Self-pay | Admitting: Gastroenterology

## 2015-06-18 ENCOUNTER — Encounter: Payer: Self-pay | Admitting: *Deleted

## 2015-06-18 LAB — STOOL CULTURE: Special Requests: NORMAL

## 2015-06-18 NOTE — Telephone Encounter (Signed)
Colon 8-11 at [facility]

## 2015-06-18 NOTE — Telephone Encounter (Signed)
Gastroenterology Pre-Procedure Review  Request Date: 8-11-20163 Requesting Physician: Dr.   PATIENT REVIEW QUESTIONS: The patient responded to the following health history questions as indicated:    1. Are you having any GI issues? no 2. Do you have a personal history of Polyps? no 3. Do you have a family history of Colon Cancer or Polyps? NO 4. Diabetes Mellitus? no 5. Joint replacements in the past 12 months?no 6. Major health problems in the past 3 months?no 7. Any artificial heart valves, MVP, or defibrillator?no    MEDICATIONS & ALLERGIES:    Patient reports the following regarding taking any anticoagulation/antiplatelet therapy:   Plavix, Coumadin, Eliquis, Xarelto, Lovenox, Pradaxa, Brilinta, or Effient? no Aspirin? NO  Patient confirms/reports the following medications:  Current Outpatient Prescriptions  Medication Sig Dispense Refill   clonazePAM (KLONOPIN) 1 MG tablet Take half to one tablet po qd prn 10 tablet 0   escitalopram (LEXAPRO) 20 MG tablet Take 20 mg by mouth daily.     Melatonin 1 MG CAPS Take by mouth.     methocarbamol (ROBAXIN) 500 MG tablet Take 500 mg by mouth 2 (two) times daily.     traMADol (ULTRAM) 50 MG tablet Take by mouth every 6 (six) hours as needed.     traZODone (DESYREL) 50 MG tablet Take 25 mg by mouth at bedtime.     No current facility-administered medications for this visit.    Patient confirms/reports the following allergies:  Allergies  Allergen Reactions   Latex     No orders of the defined types were placed in this encounter.    AUTHORIZATION INFORMATION Primary Insurance: 1D#: Group #:  Secondary Insurance: 1D#: Group #:  SCHEDULE INFORMATION: Date: 8-11 Time: [id number]

## 2015-06-19 ENCOUNTER — Encounter: Payer: Self-pay | Admitting: Anesthesiology

## 2015-06-19 NOTE — Discharge Instructions (Signed)

## 2015-06-20 LAB — O&P RESULT

## 2015-06-21 ENCOUNTER — Ambulatory Visit
Admission: RE | Admit: 2015-06-21 | Payer: BLUE CROSS/BLUE SHIELD | Source: Ambulatory Visit | Admitting: Gastroenterology

## 2015-06-21 HISTORY — DX: Constipation, unspecified: K59.00

## 2015-06-21 HISTORY — DX: Anemia, unspecified: D64.9

## 2015-06-21 HISTORY — DX: Inflammatory liver disease, unspecified: K75.9

## 2015-06-21 HISTORY — DX: Anesthesia of skin: R20.0

## 2015-06-21 HISTORY — DX: Cardiac arrhythmia, unspecified: I49.9

## 2015-06-21 HISTORY — DX: Scoliosis, unspecified: M41.9

## 2015-06-21 SURGERY — COLONOSCOPY WITH PROPOFOL
Anesthesia: Choice

## 2015-07-03 LAB — OVA AND PARASITE EXAMINATION: SPECIAL REQUESTS: NORMAL

## 2015-07-03 LAB — OVA + PARASITE EXAM

## 2015-08-20 ENCOUNTER — Telehealth: Payer: Self-pay | Admitting: Gastroenterology

## 2015-08-20 NOTE — Telephone Encounter (Signed)
Left voice message for patient to call and make appointment for rectal bleeding with Dr. Allen Norris. Notes under Media

## 2015-09-18 ENCOUNTER — Ambulatory Visit
Admission: EM | Admit: 2015-09-18 | Discharge: 2015-09-18 | Disposition: A | Discharge status: Home / Self Care | Payer: BLUE CROSS/BLUE SHIELD | Attending: Family Medicine | Admitting: Family Medicine

## 2015-09-18 ENCOUNTER — Encounter: Payer: Self-pay | Admitting: Emergency Medicine

## 2015-09-18 ENCOUNTER — Ambulatory Visit (INDEPENDENT_AMBULATORY_CARE_PROVIDER_SITE_OTHER): Payer: BLUE CROSS/BLUE SHIELD

## 2015-09-18 DIAGNOSIS — S20219A Contusion of unspecified front wall of thorax, initial encounter: Secondary | ICD-10-CM

## 2015-09-18 MED ORDER — HYDROCODONE-ACETAMINOPHEN 5-325 MG PO TABS: 1.0000 | ORAL_TABLET | Freq: Three times a day (TID) | ORAL | Status: DC | PRN

## 2015-09-18 MED ORDER — KETOROLAC TROMETHAMINE 60 MG/2ML IM SOLN
60.0000 mg | Freq: Once | INTRAMUSCULAR | Status: AC
  Administered 2015-09-18: 60 mg via INTRAMUSCULAR

## 2015-09-18 MED ORDER — MELOXICAM 15 MG PO TABS: 15.0000 mg | ORAL_TABLET | Freq: Every day | ORAL | Status: DC

## 2015-09-18 NOTE — ED Notes (Signed)
Pt with left rib pain after falling x 2 day sago

## 2015-09-18 NOTE — ED Provider Notes (Signed)
CSN: [id number]     Arrival date & time 09/18/15  1726 History   First MD Initiated Contact with Patient 09/18/15 1822    Nurses notes were reviewed. Chief Complaint  Patient presents with  . Rib Injury   patient reports falling 2 days ago while getting out of tub. She states she hit the left side of ribs on the toilet and then watch was getting up she slipped again heating the right side of her ribs as well. She states was unable to work today things got worse and causing some shortness of breath as well. She has had rib contusions before as well. (Consider location/radiation/quality/duration/timing/severity/associated sxs/prior Treatment) Patient is a 49 y.o. female presenting with chest pain. The history is provided by the patient. No language interpreter was used.  Chest Pain Pain location:  L lateral chest and R lateral chest Pain quality: sharp, shooting and throbbing   Pain radiates to:  Does not radiate Pain radiates to the back: no   Pain severity:  Severe Onset quality:  Sudden Duration:  2 days Timing:  Constant Progression:  Unchanged Chronicity:  New Context: breathing, movement, raising an arm and trauma   Relieved by:  Nothing Ineffective treatments:  None tried Associated symptoms: no shortness of breath   Risk factors: no aortic disease, no birth control, no coronary artery disease, no high cholesterol and no hypertension     Past Medical History  Diagnosis Date  . Fibromyalgia   . Anxiety   . Fibroids   . Menorrhagia   . Dysmenorrhea   . Anemia   . Constipation   . Arthritis     neck (noi limitations), fingers  . Degenerative joint disease (DJD) of lumbar spine   . Scoliosis   . Hepatitis     Hep B - as a child  . Numbness in both hands     upon awakening, then goes away  . Dysrhythmia     had irreg beat approx 12 years ago after taking ecstasy.  none since.   Past Surgical History  Procedure Laterality Date  . Ulnar collateral ligament  reconstruction    . Tonsillectomy    . Tubal ligation    . Cholecystectomy    . Nose surgery     History reviewed. No pertinent family history. Social History  Substance Use Topics  . Smoking status: Former Smoker -- 1.00 packs/day for 32 years    Types: Cigarettes    Quit date: 11/09/2012  . Smokeless tobacco: Never Used  . Alcohol Use: 1.8 oz/week    0 Standard drinks or equivalent, 3 Glasses of wine per week     Comment: occasional weekday; party weekends   OB History    No data available     Review of Systems  Respiratory: Negative for shortness of breath.   Cardiovascular: Positive for chest pain.  All other systems reviewed and are negative.   Allergies  Latex  Home Medications   Prior to Admission medications   Medication Sig Start Date End Date Taking? Authorizing Provider  clonazePAM (KLONOPIN) 1 MG tablet Take half to one tablet po qd prn 04/19/15   Norval Gable, MD  escitalopram (LEXAPRO) 20 MG tablet Take 20 mg by mouth daily.    Historical Provider, MD  HYDROcodone-acetaminophen (NORCO) 5-325 MG tablet Take 1 tablet by mouth every 8 (eight) hours as needed for moderate pain. 09/18/15   Frederich Cha, MD  Melatonin 1 MG CAPS Take by mouth.  Historical Provider, MD  meloxicam (MOBIC) 15 MG tablet Take 1 tablet (15 mg total) by mouth daily. 09/18/15   Frederich Cha, MD  methocarbamol (ROBAXIN) 500 MG tablet Take 500 mg by mouth 2 (two) times daily.    Historical Provider, MD  traMADol (ULTRAM) 50 MG tablet Take by mouth every 6 (six) hours as needed.    Historical Provider, MD  traZODone (DESYREL) 50 MG tablet Take 25 mg by mouth at bedtime.    Historical Provider, MD   Meds Ordered and Administered this Visit   Medications  ketorolac (TORADOL) injection 60 mg (60 mg Intramuscular Given 09/18/15 1902)    BP 112/69 mmHg  Pulse 65  Temp(Src) 97.8 F (36.6 C) (Tympanic)  Resp 20  Ht 5\' 4"  (1.626 m)  Wt 132 lb (59.875 kg)  BMI 22.65 kg/m2  SpO2 100%  LMP  09/10/2015 No data found.   Physical Exam  Constitutional: She is oriented to person, place, and time. She appears well-developed and well-nourished.  HENT:  Head: Normocephalic and atraumatic.  Eyes: Pupils are equal, round, and reactive to light.  Neck: Normal range of motion.  Cardiovascular: Normal rate, regular rhythm and normal heart sounds.  Exam reveals no friction rub.   No murmur heard. Pulmonary/Chest: Effort normal and breath sounds normal. No respiratory distress. She has no wheezes. Chest wall is not dull to percussion. She exhibits tenderness and bony tenderness. She exhibits no mass and no deformity.    Patient is being seen because tenderness on both sides of her ribs and chest from a fall.  Abdominal: Normal appearance. There is no hepatosplenomegaly. There is no tenderness. There is no CVA tenderness.  Musculoskeletal: Normal range of motion. She exhibits no edema or tenderness.  Neurological: She is alert and oriented to person, place, and time. She has normal reflexes. No cranial nerve deficit.  Skin: Skin is warm and dry.  Psychiatric: She has a normal mood and affect. Her behavior is normal.  Vitals reviewed.   ED Course  Procedures (including critical care time)  Labs Review Labs Reviewed - No data to display  Imaging Review Dg Ribs Unilateral W/chest Left  09/18/2015  CLINICAL DATA:  [place] out of shower on Sunday evening. Bilateral rib pain. EXAM: LEFT RIBS AND CHEST - 3+ VIEW; RIGHT RIBS AND CHEST - 3+ VIEW COMPARISON:  Chest x-ray 12/27/2014 FINDINGS: The cardiac silhouette, mediastinal and hilar contours are normal and stable. The lungs are clear. Mild hyperinflation is again demonstrated. No infiltrates, edema or effusions. No pleural effusion or pneumothorax. Bilateral ribs: No rib fractures are identified. No pleural thickening, pleural effusion or pneumothorax. IMPRESSION: No acute cardiopulmonary findings and no definite rib fractures. Electronically  Signed   By: Marijo Sanes M.D.   On: 09/18/2015 18:29   Dg Ribs Unilateral W/chest Right  09/18/2015  CLINICAL DATA:  [place] out of shower on Sunday evening. Bilateral rib pain. EXAM: LEFT RIBS AND CHEST - 3+ VIEW; RIGHT RIBS AND CHEST - 3+ VIEW COMPARISON:  Chest x-ray 12/27/2014 FINDINGS: The cardiac silhouette, mediastinal and hilar contours are normal and stable. The lungs are clear. Mild hyperinflation is again demonstrated. No infiltrates, edema or effusions. No pleural effusion or pneumothorax. Bilateral ribs: No rib fractures are identified. No pleural thickening, pleural effusion or pneumothorax. IMPRESSION: No acute cardiopulmonary findings and no definite rib fractures. Electronically Signed   By: Marijo Sanes M.D.   On: 09/18/2015 18:29     Visual Acuity Review  Right Eye Distance:  Left Eye Distance:   Bilateral Distance:    Right Eye Near:   Left Eye Near:    Bilateral Near:         MDM   1. Rib contusion, unspecified laterality, initial encounter   2. Chest wall contusion, unspecified laterality, initial encounter    Patient given 60 mg Toradol IM for pain. Warned that the pain may get worse for this better and that if pain is not better in 2 weeks for repeat chest x-ray and rib x-rays will be needed. Explained that give her a note for work for today and tomorrow and Thursday per diastolic and do because we don't to FMLA a patient's here and she was back to her doctor to fill out the longer stay. We'll place her on Vicodin and Mobic for her pain.    Frederich Cha, MD 09/18/15 [phone]

## 2015-09-18 NOTE — ED Provider Notes (Deleted)
CSN: [id number]     Arrival date & time 09/18/15  1726 History   First MD Initiated Contact with Patient 09/18/15 1822    Nurses notes were reviewed. Chief Complaint  Patient presents with  . Rib Injury    Patient states that his heart is in noticeable. He states that he continue his heart beating in his chest. 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 He states that he can hear his heart beating. He states is worse when he lays down flat. He changed position last night it went away. He notes this afternoon he was laying down on the couch and started again with his heart beating. Stasis ounce allowed to heal his physical can hear. He denies any ear pain no history of cardiac problems before in the past. He Denies having chest pain shortness of breath is so we are sensation when he hears his heart beat like. (Consider location/radiation/quality/duration/timing/severity/associated sxs/prior Treatment) Patient is a 49 y.o. female presenting with palpitations. The history is provided by the patient. No language interpreter was used.  Palpitations Palpitations quality:  Regular Onset quality:  Sudden Timing:  Sporadic Progression:  Waxing and waning Chronicity:  New Context: anxiety and blood loss   Context: not bronchodilators, not caffeine, not dehydration, not exercise, not hyperventilation, not illicit drugs and not nicotine   Relieved by:  Nothing Exacerbated by: When he lays back flat. Associated symptoms: leg pain and numbness   Associated symptoms: no back pain, no chest pain, no chest pressure, no cough, no diaphoresis, no dizziness, no lower extremity edema, no malaise/fatigue, no nausea, no near-syncope, no orthopnea, no PND, no shortness of breath and no syncope   Risk factors: stress   Risk factors comment:  Recent oncology surgery on his right thigh   Past Medical History  Diagnosis Date  . Fibromyalgia   . Anxiety   . Fibroids   . Menorrhagia   .  Dysmenorrhea   . Anemia   . Constipation   . Arthritis     neck (noi limitations), fingers  . Degenerative joint disease (DJD) of lumbar spine   . Scoliosis   . Hepatitis     Hep B - as a child  . Numbness in both hands     upon awakening, then goes away  . Dysrhythmia     had irreg beat approx 12 years ago after taking ecstasy.  none since.   Past Surgical History  Procedure Laterality Date  . Ulnar collateral ligament reconstruction    . Tonsillectomy    . Tubal ligation    . Cholecystectomy    . Nose surgery     History reviewed. No pertinent family history. Social History  Substance Use Topics  . Smoking status: Former Smoker -- 1.00 packs/day for 32 years    Types: Cigarettes    Quit date: 11/09/2012  . Smokeless tobacco: Never Used  . Alcohol Use: 1.8 oz/week    0 Standard drinks or equivalent, 3 Glasses of wine per week     Comment: occasional weekday; party weekends   OB History    No data available     Review of Systems  Constitutional: Negative for malaise/fatigue and diaphoresis.  Respiratory: Negative for cough and shortness of breath.   Cardiovascular: Positive for palpitations. Negative for chest pain, orthopnea, syncope, PND and near-syncope.  Gastrointestinal: Negative for nausea.  Musculoskeletal: Negative for back pain.  Neurological: Positive for numbness. Negative for dizziness.  Allergies  Latex  Home Medications   Prior to Admission medications   Medication Sig Start Date End Date Taking? Authorizing Provider  clonazePAM (KLONOPIN) 1 MG tablet Take half to one tablet po qd prn 04/19/15   Norval Gable, MD  escitalopram (LEXAPRO) 20 MG tablet Take 20 mg by mouth daily.    Historical Provider, MD  Melatonin 1 MG CAPS Take by mouth.    Historical Provider, MD  methocarbamol (ROBAXIN) 500 MG tablet Take 500 mg by mouth 2 (two) times daily.    Historical Provider, MD  traMADol (ULTRAM) 50 MG tablet Take by mouth every 6 (six) hours as needed.     Historical Provider, MD  traZODone (DESYREL) 50 MG tablet Take 25 mg by mouth at bedtime.    Historical Provider, MD   Meds Ordered and Administered this Visit  Medications - No data to display  BP 112/69 mmHg  Pulse 65  Temp(Src) 97.8 F (36.6 C) (Tympanic)  Resp 20  Ht 5\' 4"  (1.626 m)  Wt 132 lb (59.875 kg)  BMI 22.65 kg/m2  SpO2 100%  LMP 09/10/2015 No data found.   Physical Exam  Constitutional: She appears well-developed.  HENT:  Head: Normocephalic and atraumatic.  Eyes: Pupils are equal, round, and reactive to light.  Neck: Normal range of motion. Neck supple.  Cardiovascular: Normal rate, regular rhythm and normal heart sounds.   Pulmonary/Chest: Effort normal.  Musculoskeletal: Normal range of motion.       Right hip: She exhibits decreased strength.       Legs: Scarring right upper thigh and drain present in the patient's had recent surgery because of his sarcoma.  Neurological: She is alert.  Skin: Skin is warm and dry.  Psychiatric: Her behavior is normal.    ED Course  Procedures (including critical care time)  Labs Review Labs Reviewed - No data to display  Imaging Review No results found.   Visual Acuity Review  Right Eye Distance:   Left Eye Distance:   Bilateral Distance:    Right Eye Near:   Left Eye Near:    Bilateral Near:      ED ECG REPORT   Date: 09/18/2015  EKG Time: 6:31 PM  Rate: 91  Rhythm: normal sinus rhythm,  there are no previous tracings available for comparison, normal sinus rhythm, nonspecific ST and T waves changes  Axis: 30  Intervals:none  ST&T Change: non specific changes  Narrative Interpretation:old anterior infarct can not be ruled out            MDM   1. Heart sounds exaggerated   2. Hx of sarcoma of soft tissue    Patient was informed this time may be secondary to hardening of the vessels in his ears but I can't say for sure. I suggested that he undergo recent echo and possibly a chemical stress  test before he starts chemotherapy for his sarcoma since some of the chemotherapy medications can be cardiotoxic. Recommend follow-up with his oncologist or his PCP to get that done. If things get worse go to the ED of his choice. Attempts were made to compare EKG today with previous EKGs they appear to be benign in the past he was at [facility] and also [facility] but the EKG convexity obtained to look at and to compare with.    Frederich Cha, MD 09/18/15 [phone]

## 2015-09-18 NOTE — Discharge Instructions (Signed)
Chest Contusion A contusion is a deep bruise. Bruises happen when an injury causes bleeding under the skin. Signs of bruising include pain, puffiness (swelling), and discolored skin. The bruise may turn blue, purple, or yellow.  HOME CARE  Put ice on the injured area.  Put ice in a plastic bag.  Place a towel between the skin and the bag.  Leave the ice on for 15-20 minutes at a time, 03-04 times a day for the first 48 hours.  Only take medicine as told by your doctor.  Rest.  Take deep breaths (deep-breathing exercises) as told by your doctor.  Stop smoking if you smoke.  Do not lift objects over 5 pounds (2.3 kilograms) for 3 days or longer if told by your doctor. GET HELP RIGHT AWAY IF:   You have more bruising or puffiness.  You have pain that gets worse.  You have trouble breathing.  You are dizzy, weak, or pass out (faint).  You have blood in your pee (urine) or poop (stool).  You cough up or throw up (vomit) blood.  Your puffiness or pain is not helped with medicines. MAKE SURE YOU:   Understand these instructions.  Will watch your condition.  Will get help right away if you are not doing well or get worse.   This information is not intended to replace advice given to you by your health care provider. Make sure you discuss any questions you have with your health care provider.   Document Released: 04/14/2008 Document Revised: 07/21/2012 Document Reviewed: 04/19/2012 Elsevier Interactive Patient Education 2016 [employer]  Cryotherapy Cryotherapy is when you put ice on your injury. Ice helps lessen pain and puffiness (swelling) after an injury. Ice works the best when you start using it in the first 24 to 48 hours after an injury. HOME CARE  Put a dry or damp towel between the ice pack and your skin.  You may press gently on the ice pack.  Leave the ice on for no more than 10 to 20 minutes at a time.  Check your skin after 5 minutes to make sure  your skin is okay.  Rest at least 20 minutes between ice pack uses.  Stop using ice when your skin loses feeling (numbness).  Do not use ice on someone who cannot tell you when it hurts. This includes small children and people with memory problems (dementia). GET HELP RIGHT AWAY IF:  You have white spots on your skin.  Your skin turns blue or pale.  Your skin feels waxy or hard.  Your puffiness gets worse. MAKE SURE YOU:   Understand these instructions.  Will watch your condition.  Will get help right away if you are not doing well or get worse.   This information is not intended to replace advice given to you by your health care provider. Make sure you discuss any questions you have with your health care provider.   Document Released: 04/14/2008 Document Revised: 01/19/2012 Document Reviewed: 06/19/2011 Elsevier Interactive Patient Education 2016 [place].  Rib Contusion A rib contusion is a deep bruise on your rib area. Contusions are the result of a blunt trauma that causes bleeding and injury to the tissues under the skin. A rib contusion may involve bruising of the ribs and of the skin and muscles in the area. The skin overlying the contusion may turn blue, purple, or yellow. Minor injuries will give you a painless contusion, but more severe contusions may stay painful and swollen  for a few weeks. CAUSES  A contusion is usually caused by a blow, trauma, or direct force to an area of the body. This often occurs while playing contact sports. SYMPTOMS  Swelling and redness of the injured area.  Discoloration of the injured area.  Tenderness and soreness of the injured area.  Pain with or without movement. DIAGNOSIS  The diagnosis can be made by taking a medical history and performing a physical exam. An X-ray, CT scan, or MRI may be needed to determine if there were any associated injuries, such as broken bones (fractures) or internal injuries. TREATMENT  Often,  the best treatment for a rib contusion is rest. Icing or applying cold compresses to the injured area may help reduce swelling and inflammation. Deep breathing exercises may be recommended to reduce the risk of partial lung collapse and pneumonia. Over-the-counter or prescription medicines may also be recommended for pain control. HOME CARE INSTRUCTIONS   Apply ice to the injured area:  Put ice in a plastic bag.  Place a towel between your skin and the bag.  Leave the ice on for 20 minutes, 2-3 times per day.  Take medicines only as directed by your health care provider.  Rest the injured area. Avoid strenuous activity and any activities or movements that cause pain. Be careful during activities and avoid bumping the injured area.  Perform deep-breathing exercises as directed by your health care provider.  Do not lift anything that is heavier than 5 lb (2.3 kg) until your health care provider approves.  Do not use any tobacco products, including cigarettes, chewing tobacco, or electronic cigarettes. If you need help quitting, ask your health care provider. SEEK MEDICAL CARE IF:   You have increased bruising or swelling.  You have pain that is not controlled with treatment.  You have a fever. SEEK IMMEDIATE MEDICAL CARE IF:   You have difficulty breathing or shortness of breath.  You develop a continual cough, or you cough up thick or bloody sputum.  You feel sick to your stomach (nauseous), you throw up (vomit), or you have abdominal pain.   This information is not intended to replace advice given to you by your health care provider. Make sure you discuss any questions you have with your health care provider.   Document Released: 07/22/2001 Document Revised: 11/17/2014 Document Reviewed: 08/08/2014 Elsevier Interactive Patient Education [employer].

## 2015-10-30 ENCOUNTER — Ambulatory Visit
Admission: EM | Admit: 2015-10-30 | Discharge: 2015-10-30 | Disposition: A | Discharge status: Home / Self Care | Payer: BLUE CROSS/BLUE SHIELD | Attending: Family Medicine | Admitting: Family Medicine

## 2015-10-30 DIAGNOSIS — B9789 Other viral agents as the cause of diseases classified elsewhere: Secondary | ICD-10-CM

## 2015-10-30 DIAGNOSIS — H6991 Unspecified Eustachian tube disorder, right ear: Secondary | ICD-10-CM | POA: Diagnosis not present

## 2015-10-30 DIAGNOSIS — J028 Acute pharyngitis due to other specified organisms: Principal | ICD-10-CM

## 2015-10-30 DIAGNOSIS — J029 Acute pharyngitis, unspecified: Secondary | ICD-10-CM

## 2015-10-30 LAB — RAPID STREP SCREEN (MED CTR MEBANE ONLY): Streptococcus, Group A Screen (Direct): NEGATIVE

## 2015-10-30 MED ORDER — FLUTICASONE PROPIONATE 50 MCG/ACT NA SUSP: 2.0000 | Freq: Every day | NASAL | Status: DC

## 2015-10-30 MED ORDER — BENZONATATE 200 MG PO CAPS: 200.0000 mg | ORAL_CAPSULE | Freq: Three times a day (TID) | ORAL | Status: DC | PRN

## 2015-10-30 NOTE — Discharge Instructions (Signed)
Rapid Strep Test Strep throat is a bacterial infection caused by the bacteria Streptococcus pyogenes. A rapid strep test is the quickest way to check if these bacteria are causing your sore throat. The test can be done at your health care provider's office. Results are usually ready in 10-20 minutes. You may have this test if you have symptoms of strep throat. These include:   A red throat with yellow or white spots.  Neck swelling and tenderness.  Fever.  Loss of appetite.  Trouble breathing or swallowing.  Rash.  Dehydration. This test requires a sample of fluid from the back of your throat and tonsils. Your health care provider may hold down your tongue with a tongue depressor and use a swab to collect the sample.  Your health care provider may collect a second sample at the same time. The second sample may be used for a throat culture. In a culture test, the sample is combined with a substance that encourages bacteria to grow. It takes longer to get the results of the throat culture test, but they are more accurate. They can confirm the results from a rapid strep test, or show that those results were wrong. RESULTS  It is your responsibility to obtain your test results. Ask the lab or department performing the test when and how you will get your results. Contact your health care provider to discuss any questions you have about your results.  The results of the rapid strep test will be negative or positive.  Meaning of Negative Test Results If the result of your rapid strep test is negative, then it means:   It is likely that you do not have strep throat.  A virus may be causing your sore throat. Your health care provider may do a throat culture to confirm the results of the rapid strep test. The throat culture can also identify the different strains of strep bacteria. Meaning of Positive Test Results If the result of your rapid strep test is positive, then it means:  It is likely  that you do have strep throat.  You may have to take antibiotics. Your health care provider may do a throat culture to confirm the results of the rapid strep test. Strep throat usually requires a course of antibiotics.    This information is not intended to replace advice given to you by your health care provider. Make sure you discuss any questions you have with your health care provider.   Document Released: 12/04/2004 Document Revised: 11/17/2014 Document Reviewed: 02/02/2014 Elsevier Interactive Patient Education 2016 Elsevier Inc.  Sore Throat A sore throat is pain, burning, irritation, or scratchiness of the throat. There is often pain or tenderness when swallowing or talking. A sore throat may be accompanied by other symptoms, such as coughing, sneezing, fever, and swollen neck glands. A sore throat is often the first sign of another sickness, such as a cold, flu, strep throat, or mononucleosis (commonly known as mono). Most sore throats go away without medical treatment. CAUSES  The most common causes of a sore throat include:  A viral infection, such as a cold, flu, or mono.  A bacterial infection, such as strep throat, tonsillitis, or whooping cough.  Seasonal allergies.  Dryness in the air.  Irritants, such as smoke or pollution.  Gastroesophageal reflux disease (GERD). HOME CARE INSTRUCTIONS   Only take over-the-counter medicines as directed by your caregiver.  Drink enough fluids to keep your urine clear or pale yellow.  Rest as needed.  Try using throat sprays, lozenges, or sucking on hard candy to ease any pain (if older than 4 years or as directed).  Sip warm liquids, such as broth, herbal tea, or warm water with honey to relieve pain temporarily. You may also eat or drink cold or frozen liquids such as frozen ice pops.  Gargle with salt water (mix 1 tsp salt with 8 oz of water).  Do not smoke and avoid secondhand smoke.  Put a cool-mist humidifier in your  bedroom at night to moisten the air. You can also turn on a hot shower and sit in the bathroom with the door closed for 5-10 minutes. SEEK IMMEDIATE MEDICAL CARE IF:  You have difficulty breathing.  You are unable to swallow fluids, soft foods, or your saliva.  You have increased swelling in the throat.  Your sore throat does not get better in 7 days.  You have nausea and vomiting.  You have a fever or persistent symptoms for more than 2-3 days.  You have a fever and your symptoms suddenly get worse. MAKE SURE YOU:   Understand these instructions.  Will watch your condition.  Will get help right away if you are not doing well or get worse.   This information is not intended to replace advice given to you by your health care provider. Make sure you discuss any questions you have with your health care provider.   Document Released: 12/04/2004 Document Revised: 11/17/2014 Document Reviewed: 07/04/2012 Elsevier Interactive Patient Education [employer].

## 2015-10-30 NOTE — ED Provider Notes (Signed)
CSN: [id number]     Arrival date & time 10/30/15  0815 History   First MD Initiated Contact with Patient 10/30/15 [phone]     Chief Complaint  Patient presents with  . Sore Throat   (Consider location/radiation/quality/duration/timing/severity/associated sxs/prior Treatment) HPI   49 year old female who presents with a 2 day history of sore throat that seems to be mostly right-sided. States that the pain extends into her throat along her jawline. She states that she also has a large amount of mucus is very tenuous and is difficult to either coughed up or swallow. She has very little coughing and no productive cough. She has been running a fever but is mostly tactile. Been taking Tylenol for the fevers since NSAIDs seem to cause her gastric upset. He states that she is off of work on through the holidays and does report back to January 1. She has unfortunately returned to smoking after quitting for several months. The blood pressure recorded today was 88/58; patient states that this is totally normal for her in the morning as she took her trazodone late last night and has not completely awakened.  She is asymptomatic and tolerating the low blood pressure.   Past Medical History  Diagnosis Date  . Fibromyalgia   . Anxiety   . Fibroids   . Menorrhagia   . Dysmenorrhea   . Anemia   . Constipation   . Arthritis     neck (noi limitations), fingers  . Degenerative joint disease (DJD) of lumbar spine   . Scoliosis   . Hepatitis     Hep B - as a child  . Numbness in both hands     upon awakening, then goes away  . Dysrhythmia     had irreg beat approx 12 years ago after taking ecstasy.  none since.   Past Surgical History  Procedure Laterality Date  . Ulnar collateral ligament reconstruction    . Tonsillectomy    . Tubal ligation    . Cholecystectomy    . Nose surgery     History reviewed. No pertinent family history. Social History  Substance Use Topics  . Smoking status: Former  Smoker -- 1.00 packs/day for 32 years    Types: Cigarettes    Quit date: 11/09/2012  . Smokeless tobacco: Never Used  . Alcohol Use: 1.8 oz/week    3 Glasses of wine, 0 Standard drinks or equivalent per week     Comment: occasional weekday; party weekends   OB History    No data available     Review of Systems  Constitutional: Positive for fever, chills and appetite change. Negative for activity change.  HENT: Positive for congestion, ear pain, postnasal drip and sore throat.   Respiratory: Positive for cough.   All other systems reviewed and are negative.   Allergies  Latex  Home Medications   Prior to Admission medications   Medication Sig Start Date End Date Taking? Authorizing Provider  escitalopram (LEXAPRO) 20 MG tablet Take 20 mg by mouth daily.   Yes Historical Provider, MD  traMADol (ULTRAM) 50 MG tablet Take by mouth every 6 (six) hours as needed.   Yes Historical Provider, MD  traZODone (DESYREL) 50 MG tablet Take 25 mg by mouth at bedtime.   Yes Historical Provider, MD  benzonatate (TESSALON) 200 MG capsule Take 1 capsule (200 mg total) by mouth 3 (three) times daily as needed for cough. 10/30/15   Lorin Picket, PA-C  clonazePAM (KLONOPIN) 1 MG tablet  Take half to one tablet po qd prn 04/19/15   Norval Gable, MD  fluticasone [facility]) 50 MCG/ACT nasal spray Place 2 sprays into both nostrils daily. 10/30/15   Lorin Picket, PA-C  HYDROcodone-acetaminophen (NORCO) 5-325 MG tablet Take 1 tablet by mouth every 8 (eight) hours as needed for moderate pain. 09/18/15   Frederich Cha, MD  Melatonin 1 MG CAPS Take by mouth.    Historical Provider, MD  meloxicam (MOBIC) 15 MG tablet Take 1 tablet (15 mg total) by mouth daily. 09/18/15   Frederich Cha, MD  methocarbamol (ROBAXIN) 500 MG tablet Take 500 mg by mouth 2 (two) times daily.    Historical Provider, MD   Meds Ordered and Administered this Visit  Medications - No data to display  BP 88/58 mmHg  Pulse 68  Temp(Src)  97.6 F (36.4 C) (Tympanic)  Resp 16  Ht 5\' 2"  (1.575 m)  Wt 127 lb (57.607 kg)  BMI 23.22 kg/m2  SpO2 100%  LMP 10/29/2015 (Exact Date) No data found.   Physical Exam  Constitutional: She is oriented to person, place, and time. She appears well-developed and well-nourished. No distress.  HENT:  Head: Normocephalic and atraumatic.  Left Ear: External ear normal.  Nose: Nose normal.  Mouth/Throat: Oropharynx is clear and moist.  Examination of the right ear shows a very dull TM. She has a large amount of postnasal drip in the posterior pharynx. Is no significant erythema. There is no lymphadenopathy appreciated.  Eyes: Conjunctivae are normal. Pupils are equal, round, and reactive to light.  Neck: Normal range of motion. Neck supple. No thyromegaly present.  Pulmonary/Chest: Effort normal and breath sounds normal. No respiratory distress. She has no wheezes. She has no rales.  Musculoskeletal: Normal range of motion. She exhibits no edema or tenderness.  Lymphadenopathy:    She has no cervical adenopathy.  Neurological: She is alert and oriented to person, place, and time.  Skin: Skin is warm and dry. She is not diaphoretic.  Psychiatric: She has a normal mood and affect. Her behavior is normal. Judgment and thought content normal.  Nursing note and vitals reviewed.   ED Course  Procedures (including critical care time)  Labs Review Labs Reviewed  RAPID STREP SCREEN (NOT AT [facility])  CULTURE, GROUP A STREP ([facility] ONLY)    Imaging Review No results found.   Visual Acuity Review  Right Eye Distance:   Left Eye Distance:   Bilateral Distance:    Right Eye Near:   Left Eye Near:    Bilateral Near:         MDM   1. Sore throat (viral)   2. Eustachian tube disorder, right    Discharge Medication List as of 10/30/2015  9:01 AM    START taking these medications   Details  benzonatate (TESSALON) 200 MG capsule Take 1 capsule (200 mg total) by mouth 3 (three) times  daily as needed for cough., Starting 10/30/2015, Until Discontinued, Normal    fluticasone (FLONASE) 50 MCG/ACT nasal spray Place 2 sprays into both nostrils daily., Starting 10/30/2015, Until Discontinued, Normal      Plan: 1. Test/x-ray results and diagnosis reviewed with patient 2. rx as per orders; risks, benefits, potential side effects reviewed with patient 3. Recommend supportive treatment with salt water gargles.Tylenol asa PRN. 4. F/u prn if symptoms worsen or don't improve     Lorin Picket, PA-C 10/30/15 [phone]

## 2015-10-30 NOTE — ED Notes (Signed)
Started 2 days ago with sore throat. States right side of throat "more painful"

## 2015-11-01 LAB — CULTURE, GROUP A STREP (THRC)

## 2016-03-27 ENCOUNTER — Other Ambulatory Visit: Payer: Self-pay | Admitting: Family Medicine

## 2016-03-27 DIAGNOSIS — Z1231 Encounter for screening mammogram for malignant neoplasm of breast: Secondary | ICD-10-CM

## 2016-05-29 ENCOUNTER — Ambulatory Visit
Admission: EM | Admit: 2016-05-29 | Discharge: 2016-05-29 | Discharge status: Against Medical Advice | Payer: BLUE CROSS/BLUE SHIELD

## 2016-05-29 ENCOUNTER — Encounter: Payer: Self-pay | Admitting: *Deleted

## 2016-05-29 NOTE — ED Notes (Signed)
Pt stated dissatisfaction with the amount of time she has waited and walked out.

## 2016-05-29 NOTE — ED Notes (Signed)
Pt fell out of a hammock on Saturday and fell while hiking on Sunday. C/o right sided rib pain that impairs deep breaths and left elbow pain with edema.

## 2016-08-19 ENCOUNTER — Ambulatory Visit
Admission: RE | Admit: 2016-08-19 | Discharge: 2016-08-19 | Disposition: A | Discharge status: Home / Self Care | Payer: BLUE CROSS/BLUE SHIELD | Source: Ambulatory Visit | Attending: Family Medicine | Admitting: Family Medicine

## 2016-08-19 ENCOUNTER — Other Ambulatory Visit: Payer: Self-pay | Admitting: Family Medicine

## 2016-08-19 DIAGNOSIS — Z1231 Encounter for screening mammogram for malignant neoplasm of breast: Secondary | ICD-10-CM | POA: Diagnosis not present

## 2016-08-26 ENCOUNTER — Other Ambulatory Visit: Payer: Self-pay | Admitting: Family Medicine

## 2016-08-26 DIAGNOSIS — N6320 Unspecified lump in the left breast, unspecified quadrant: Secondary | ICD-10-CM

## 2016-08-26 DIAGNOSIS — N6310 Unspecified lump in the right breast, unspecified quadrant: Secondary | ICD-10-CM

## 2016-08-28 ENCOUNTER — Ambulatory Visit
Admission: RE | Admit: 2016-08-28 | Discharge: 2016-08-28 | Disposition: A | Discharge status: Home / Self Care | Payer: BLUE CROSS/BLUE SHIELD | Source: Ambulatory Visit | Attending: Family Medicine | Admitting: Family Medicine

## 2016-08-28 DIAGNOSIS — N6310 Unspecified lump in the right breast, unspecified quadrant: Secondary | ICD-10-CM

## 2016-08-28 DIAGNOSIS — N6001 Solitary cyst of right breast: Secondary | ICD-10-CM | POA: Diagnosis not present

## 2016-08-28 DIAGNOSIS — N6320 Unspecified lump in the left breast, unspecified quadrant: Secondary | ICD-10-CM

## 2016-08-28 DIAGNOSIS — N6002 Solitary cyst of left breast: Secondary | ICD-10-CM | POA: Insufficient documentation

## 2016-09-15 ENCOUNTER — Ambulatory Visit
Admission: EM | Admit: 2016-09-15 | Discharge: 2016-09-15 | Disposition: A | Discharge status: Home / Self Care | Payer: BLUE CROSS/BLUE SHIELD | Attending: Family Medicine | Admitting: Family Medicine

## 2016-09-15 DIAGNOSIS — J069 Acute upper respiratory infection, unspecified: Secondary | ICD-10-CM | POA: Diagnosis not present

## 2016-09-15 MED ORDER — DOXYCYCLINE HYCLATE 100 MG PO CAPS: 100.0000 mg | ORAL_CAPSULE | Freq: Two times a day (BID) | ORAL | 0 refills | Status: DC

## 2016-09-15 MED ORDER — ALBUTEROL SULFATE HFA 108 (90 BASE) MCG/ACT IN AERS: 1.0000 | INHALATION_SPRAY | Freq: Four times a day (QID) | RESPIRATORY_TRACT | 0 refills | Status: AC | PRN

## 2016-09-15 NOTE — ED Triage Notes (Signed)
Patient complains of cough and congestion x 1 week with drainage. Patient states that she has tried flonase without relief and OTC medication.

## 2016-09-15 NOTE — ED Provider Notes (Signed)
CSN: [id number]     Arrival date & time 09/15/16  1433 History   None    Chief Complaint  Patient presents with  . Cough   (Consider location/radiation/quality/duration/timing/severity/associated sxs/prior Treatment) HPI  Is a 50 year old female who presents with productive cough of green sputum for 1 week. Says she's been coughing so much and so violently that her chest is actually hurting. Continues to smoke 1 pack of cigarettes per day. She's been using Flonase and over-the-counter medication without any relief. She will have this at least yearly always about this time. O2 sats are 97% on room air today and she normally sats at 99% per her own recollection. Temperature is 98.1.       Past Medical History:  Diagnosis Date  . Anemia   . Anxiety   . Arthritis    neck (noi limitations), fingers  . Constipation   . Degenerative joint disease (DJD) of lumbar spine   . Dysmenorrhea   . Dysrhythmia    had irreg beat approx 12 years ago after taking ecstasy.  none since.  . Fibroids   . Fibromyalgia   . Hepatitis    Hep B - as a child  . Menorrhagia   . Numbness in both hands    upon awakening, then goes away  . Scoliosis    Past Surgical History:  Procedure Laterality Date  . BREAST CYST ASPIRATION Right 2013  . CHOLECYSTECTOMY    . NOSE SURGERY    . TONSILLECTOMY    . TUBAL LIGATION    . ULNAR COLLATERAL LIGAMENT RECONSTRUCTION     Family History  Problem Relation Age of Onset  . Breast cancer Mother 17  . Hypertension Mother   . Hyperlipidemia Mother   . Breast cancer Cousin 25    pat cousin   Social History  Substance Use Topics  . Smoking status: Current Every Day Smoker    Packs/day: 1.00    Years: 32.00    Types: Cigarettes    Last attempt to quit: 11/09/2012  . Smokeless tobacco: Never Used  . Alcohol use 1.8 oz/week    3 Glasses of wine per week     Comment: occasional weekday; party weekends   OB History    No data available     Review of  Systems  Constitutional: Positive for activity change. Negative for chills, fatigue and fever.  HENT: Positive for congestion.   Respiratory: Positive for cough, shortness of breath and wheezing. Negative for stridor.   All other systems reviewed and are negative.   Allergies  Latex  Home Medications   Prior to Admission medications   Medication Sig Start Date End Date Taking? Authorizing Provider  albuterol (PROVENTIL HFA;VENTOLIN HFA) 108 (90 Base) MCG/ACT inhaler Inhale into the lungs every 6 (six) hours as needed for wheezing or shortness of breath.   Yes Historical Provider, MD  buPROPion (WELLBUTRIN SR) 150 MG 12 hr tablet Take 150 mg by mouth 2 (two) times daily.   Yes Historical Provider, MD  escitalopram (LEXAPRO) 20 MG tablet Take 20 mg by mouth daily.   Yes Historical Provider, MD  fluticasone (FLONASE) 50 MCG/ACT nasal spray Place 2 sprays into both nostrils daily. 10/30/15  Yes Lorin Picket, PA-C  methocarbamol (ROBAXIN) 500 MG tablet Take 500 mg by mouth 2 (two) times daily.   Yes Historical Provider, MD  traZODone (DESYREL) 50 MG tablet Take 25 mg by mouth at bedtime.   Yes Historical Provider, MD  albuterol (PROVENTIL  HFA;VENTOLIN HFA) 108 (90 Base) MCG/ACT inhaler Inhale 1-2 puffs into the lungs every 6 (six) hours as needed for wheezing or shortness of breath. 09/15/16   Lorin Picket, PA-C  doxycycline (VIBRAMYCIN) 100 MG capsule Take 1 capsule (100 mg total) by mouth 2 (two) times daily. 09/15/16   Lorin Picket, PA-C   Meds Ordered and Administered this Visit  Medications - No data to display  BP 114/69 (BP Location: Left Arm)   Pulse 77   Temp 98.1 F (36.7 C) (Oral)   Resp 16   Ht 5\' 2"  (1.575 m)   Wt 136 lb (61.7 kg)   SpO2 97%   BMI 24.87 kg/m  No data found.   Physical Exam  Constitutional: She is oriented to person, place, and time. She appears well-developed and well-nourished. No distress.  HENT:  Head: Normocephalic and atraumatic.   Right Ear: External ear normal.  Left Ear: External ear normal.  Nose: Nose normal.  Mouth/Throat: Oropharynx is clear and moist. No oropharyngeal exudate.  Eyes: EOM are normal. Pupils are equal, round, and reactive to light. Right eye exhibits no discharge. Left eye exhibits no discharge.  Neck: Normal range of motion. Neck supple.  Pulmonary/Chest: Effort normal and breath sounds normal. No respiratory distress. She has no wheezes. She has no rales. She exhibits no tenderness.  Musculoskeletal: Normal range of motion.  Lymphadenopathy:    She has no cervical adenopathy.  Neurological: She is alert and oriented to person, place, and time.  Skin: Skin is warm and dry. She is not diaphoretic.  Psychiatric: She has a normal mood and affect. Her behavior is normal. Judgment and thought content normal.  Nursing note and vitals reviewed.   Urgent Care Course   Clinical Course     Procedures (including critical care time)  Labs Review Labs Reviewed - No data to display  Imaging Review No results found.   Visual Acuity Review  Right Eye Distance:   Left Eye Distance:   Bilateral Distance:    Right Eye Near:   Left Eye Near:    Bilateral Near:         MDM   1. Acute upper respiratory infection    Discharge Medication List as of 09/15/2016  3:57 PM    START taking these medications   Details  !! albuterol (PROVENTIL HFA;VENTOLIN HFA) 108 (90 Base) MCG/ACT inhaler Inhale 1-2 puffs into the lungs every 6 (six) hours as needed for wheezing or shortness of breath., Starting Mon 09/15/2016, Normal    doxycycline (VIBRAMYCIN) 100 MG capsule Take 1 capsule (100 mg total) by mouth 2 (two) times daily., Starting Mon 09/15/2016, Normal     !! - Potential duplicate medications found. Please discuss with provider.    Plan: 1. Test/x-ray results and diagnosis reviewed with patient 2. rx as per orders; risks, benefits, potential side effects reviewed with patient 3. Recommend  supportive treatment with Topping smoking as soon as possible. Use albuterol for increased breathing capacity. Examination may have an element of COPD I will need to have further evaluation for proved. I will treat this as an exacerbation of some underlying COPD as a possibility. She is not improving she should follow-up with her primary care physician 4. F/u prn if symptoms worsen or don't improve     Lorin Picket, PA-C 09/15/16 [address] [place], [place] 09/15/16 1746

## 2017-07-14 ENCOUNTER — Ambulatory Visit
Admission: EM | Admit: 2017-07-14 | Discharge: 2017-07-14 | Disposition: A | Discharge status: Home / Self Care | Payer: BLUE CROSS/BLUE SHIELD | Attending: Family Medicine | Admitting: Family Medicine

## 2017-07-14 ENCOUNTER — Ambulatory Visit (INDEPENDENT_AMBULATORY_CARE_PROVIDER_SITE_OTHER): Payer: BLUE CROSS/BLUE SHIELD

## 2017-07-14 DIAGNOSIS — R0781 Pleurodynia: Secondary | ICD-10-CM | POA: Diagnosis not present

## 2017-07-14 DIAGNOSIS — Z8709 Personal history of other diseases of the respiratory system: Secondary | ICD-10-CM | POA: Diagnosis not present

## 2017-07-14 DIAGNOSIS — J441 Chronic obstructive pulmonary disease with (acute) exacerbation: Secondary | ICD-10-CM

## 2017-07-14 DIAGNOSIS — R05 Cough: Secondary | ICD-10-CM

## 2017-07-14 MED ORDER — BENZONATATE 200 MG PO CAPS: 200.0000 mg | ORAL_CAPSULE | Freq: Three times a day (TID) | ORAL | 0 refills | Status: DC | PRN

## 2017-07-14 MED ORDER — METHYLPREDNISOLONE SODIUM SUCC 125 MG IJ SOLR
125.0000 mg | Freq: Once | INTRAMUSCULAR | Status: AC
  Administered 2017-07-14: 125 mg via INTRAMUSCULAR

## 2017-07-14 MED ORDER — MELOXICAM 15 MG PO TABS: 15.0000 mg | ORAL_TABLET | Freq: Every day | ORAL | 1 refills | Status: DC

## 2017-07-14 MED ORDER — AZITHROMYCIN 500 MG PO TABS: ORAL_TABLET | ORAL | 0 refills | Status: DC

## 2017-07-14 MED ORDER — PREDNISONE 10 MG (21) PO TBPK: ORAL_TABLET | ORAL | 0 refills | Status: DC

## 2017-07-14 NOTE — ED Provider Notes (Signed)
[facility]    CSN: [id number] Arrival date & time: 07/14/17  1215     History   Chief Complaint Chief Complaint  Patient presents with  . Cough    HPI Julia Hoover is a 51 y.o. female.   Patient is a 51 year old white female who states that she thinks she has pleurisy. She states that the chest pain is in her left posterior mid back that hurts when she coughs or takes a deep breath. She's had pleurisy before and states this is similar to the person she's had before. She does smoke. She states is been coughing now for over 2 weeks. She's been wheezing onset use her inhaler. She denies any fever and states she is coughing up is yellowish green thick sputum at this time she is allergic to latex. To correct chart she is still smoking and not a former smoker. She has a past history anemia anxiety and arthritis. She's has arthritis rhomboids fibromyalgia arrhythmia before about 12 years ago after taking ecstasy. She's had breast cyst aspiration cholecystectomy nasal surgery tonsillectomy and ulnar collateral ligament reconstruction. Past medical history mother has hypertension breast cancer and hyperlipidemia   The history is provided by the patient. No language interpreter was used.  Cough  Cough characteristics:  Productive Sputum characteristics:  Green and yellow Severity:  Severe Onset quality:  Sudden Timing:  Constant Progression:  Worsening Context: upper respiratory infection   Relieved by:  Nothing Worsened by:  Nothing Associated symptoms: chest pain, shortness of breath and sore throat     Past Medical History:  Diagnosis Date  . Anemia   . Anxiety   . Arthritis    neck (noi limitations), fingers  . Constipation   . Degenerative joint disease (DJD) of lumbar spine   . Dysmenorrhea   . Dysrhythmia    had irreg beat approx 12 years ago after taking ecstasy.  none since.  . Fibroids   . Fibromyalgia   . Hepatitis    Hep B - as a child  .  Menorrhagia   . Numbness in both hands    upon awakening, then goes away  . Scoliosis     There are no active problems to display for this patient.   Past Surgical History:  Procedure Laterality Date  . BREAST CYST ASPIRATION Right 2013  . CHOLECYSTECTOMY    . NOSE SURGERY    . TONSILLECTOMY    . TUBAL LIGATION    . ULNAR COLLATERAL LIGAMENT RECONSTRUCTION      OB History    No data available       Home Medications    Prior to Admission medications   Medication Sig Start Date End Date Taking? Authorizing Provider  Brexpiprazole (REXULTI) 2 MG TABS Take by mouth.   Yes [provider]  albuterol (PROVENTIL HFA;VENTOLIN HFA) 108 (90 Base) MCG/ACT inhaler Inhale into the lungs every 6 (six) hours as needed for wheezing or shortness of breath.    [provider]  albuterol (PROVENTIL HFA;VENTOLIN HFA) 108 (90 Base) MCG/ACT inhaler Inhale 1-2 puffs into the lungs every 6 (six) hours as needed for wheezing or shortness of breath. 09/15/16   Lorin Picket, PA-C  azithromycin (ZITHROMAX) 500 MG tablet 1 tablet a day for 5 days 07/14/17   Frederich Cha, MD  benzonatate (TESSALON) 200 MG capsule Take 1 capsule (200 mg total) by mouth 3 (three) times daily as needed. 07/14/17   Frederich Cha, MD  buPROPion [facility] SR)  150 MG 12 hr tablet Take 150 mg by mouth 2 (two) times daily.    [provider]  doxycycline (VIBRAMYCIN) 100 MG capsule Take 1 capsule (100 mg total) by mouth 2 (two) times daily. 09/15/16   Lorin Picket, PA-C  escitalopram (LEXAPRO) 20 MG tablet Take 20 mg by mouth daily.    [provider]  fluticasone (FLONASE) 50 MCG/ACT nasal spray Place 2 sprays into both nostrils daily. 10/30/15   Lorin Picket, PA-C  meloxicam (MOBIC) 15 MG tablet Take 1 tablet (15 mg total) by mouth daily. 07/14/17   Frederich Cha, MD  methocarbamol (ROBAXIN) 500 MG tablet Take 500 mg by mouth 2 (two) times daily.    [provider]  predniSONE  (STERAPRED UNI-PAK 21 TAB) 10 MG (21) TBPK tablet Sig 6 tablet day 1, 5 tablets day 2, 4 tablets day 3,,3tablets day 4, 2 tablets day 5, 1 tablet day 6 take all tablets orally 07/14/17   Frederich Cha, MD  traZODone (DESYREL) 50 MG tablet Take 25 mg by mouth at bedtime.    [provider]    Family History Family History  Problem Relation Age of Onset  . Breast cancer Mother 40  . Hypertension Mother   . Hyperlipidemia Mother   . Breast cancer Cousin 44       pat cousin    Social History Social History  Substance Use Topics  . Smoking status: Former Smoker    Packs/day: 1.00    Years: 32.00    Types: Cigarettes  . Smokeless tobacco: Never Used  . Alcohol use 1.8 oz/week    3 Glasses of wine per week     Comment: occasional weekday; party weekends     Allergies   Latex   Review of Systems Review of Systems  HENT: Positive for sinus pain, sinus pressure and sore throat.   Respiratory: Positive for cough and shortness of breath.   Cardiovascular: Positive for chest pain.  All other systems reviewed and are negative.    Physical Exam Triage Vital Signs ED Triage Vitals  Enc Vitals Group     BP 07/14/17 1234 117/79     Pulse Rate 07/14/17 1234 (!) 56     Resp 07/14/17 1234 18     Temp 07/14/17 1234 97.9 F (36.6 C)     Temp src --      SpO2 07/14/17 1234 100 %     Weight 07/14/17 1235 152 lb (68.9 kg)     Height 07/14/17 1235 5' 2.5" (1.588 m)     Head Circumference --      Peak Flow --      Pain Score 07/14/17 1236 2     Pain Loc --      Pain Edu? --      Excl. in [place]? --    No data found.   Updated Vital Signs BP 117/79 (BP Location: Left Arm)   Pulse (!) 56   Temp 97.9 F (36.6 C)   Resp 18   Ht 5' 2.5" (1.588 m)   Wt 152 lb (68.9 kg)   LMP 06/14/2017 Comment: tubal  SpO2 100%   BMI 27.36 kg/m   Visual Acuity Right Eye Distance:   Left Eye Distance:   Bilateral Distance:    Right Eye Near:   Left Eye Near:    Bilateral Near:      Physical Exam  Constitutional: She is oriented to person, place, and time. She appears well-developed  and well-nourished.  HENT:  Head: Normocephalic and atraumatic.  Right Ear: External ear normal.  Left Ear: External ear normal.  Nose: Nose normal.  Mouth/Throat: Oropharynx is clear and moist.  Eyes: Pupils are equal, round, and reactive to light.  Neck: Normal range of motion. Neck supple.  Cardiovascular: Normal rate, regular rhythm and normal heart sounds.   Pulmonary/Chest: Effort normal and breath sounds normal.  Musculoskeletal: Normal range of motion.  Neurological: She is alert and oriented to person, place, and time.  Skin: Skin is warm.  Psychiatric: She has a normal mood and affect.  Vitals reviewed.    UC Treatments / Results  Labs (all labs ordered are listed, but only abnormal results are displayed) Labs Reviewed - No data to display  EKG  EKG Interpretation None       Radiology Dg Chest 2 View  Result Date: 07/14/2017 CLINICAL DATA:  Cough with yellow sputum EXAM: CHEST  2 VIEW COMPARISON:  09/18/2015 FINDINGS: The heart size and mediastinal contours are within normal limits. Both lungs are clear. The visualized skeletal structures are unremarkable. IMPRESSION: No active cardiopulmonary disease. Electronically Signed   By: Donavan Foil M.D.   On: 07/14/2017 14:16    Procedures Procedures (including critical care time)  Medications Ordered in UC Medications  methylPREDNISolone sodium succinate (SOLU-MEDROL) 125 mg/2 mL injection 125 mg (125 mg Intramuscular Given 07/14/17 1432)     Initial Impression / Assessment and Plan / UC Course  I have reviewed the triage vital signs and the nursing notes.  Pertinent labs & imaging results that were available during my care of the patient were reviewed by me and considered in my medical decision making (see chart for details).     We'll obtain a chest x-ray on patient since she does have a history of  pleurisy make sure this is not pneumonia. If pneumonia is not present will place on anabiotic either Zithromax or Ceftin and Mobic for the pain and discomfort may need to place her on prednisone Dosepak as well  Final Clinical Impressions(s) / UC Diagnoses   Final diagnoses:  History of pleurisy  Pleuritic chest pain  COPD with acute exacerbation (HCC)    New Prescriptions New Prescriptions   AZITHROMYCIN (ZITHROMAX) 500 MG TABLET    1 tablet a day for 5 days   BENZONATATE (TESSALON) 200 MG CAPSULE    Take 1 capsule (200 mg total) by mouth 3 (three) times daily as needed.   MELOXICAM (MOBIC) 15 MG TABLET    Take 1 tablet (15 mg total) by mouth daily.   PREDNISONE (STERAPRED UNI-PAK 21 TAB) 10 MG (21) TBPK TABLET    Sig 6 tablet day 1, 5 tablets day 2, 4 tablets day 3,,3tablets day 4, 2 tablets day 5, 1 tablet day 6 take all tablets orally    Note: This dictation was prepared with Dragon dictation along with smaller phrase technology. Any transcriptional errors that result from this process are unintentional. Controlled Substance Prescriptions [place] Controlled Substance Registry consulted? Not Applicable   Frederich Cha, MD 07/14/17 1434

## 2017-07-14 NOTE — ED Triage Notes (Signed)
Pt reports "I have pleurisy." Has had cough productive of yellow sputum. Denies other sx until this a.m. When she woke up with pain in her "lung" when taking a deep breath and moving certain ways.  Pain 2/10

## 2018-07-25 IMAGING — US US BREAST*L* LIMITED INC AXILLA
1 series · 13 of 13 positions shown · non-contrast
Comparison: Previous exam(s).

CLINICAL DATA: Callback from screening mammogram for possible
bilateral masses

EXAM:
2D DIGITAL DIAGNOSTIC BILATERAL MAMMOGRAM WITH CAD AND ADJUNCT TOMO
ULTRASOUND BILATERAL BREAST

[Series 1: us breast*left* limited inc axilla · 0.07mm/px · 13 of 13 slices shown]
[im 1/13]
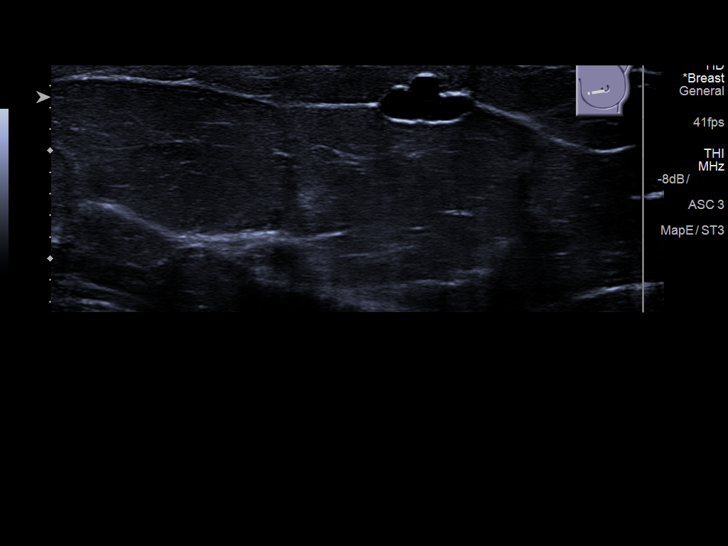
[im 2/13]
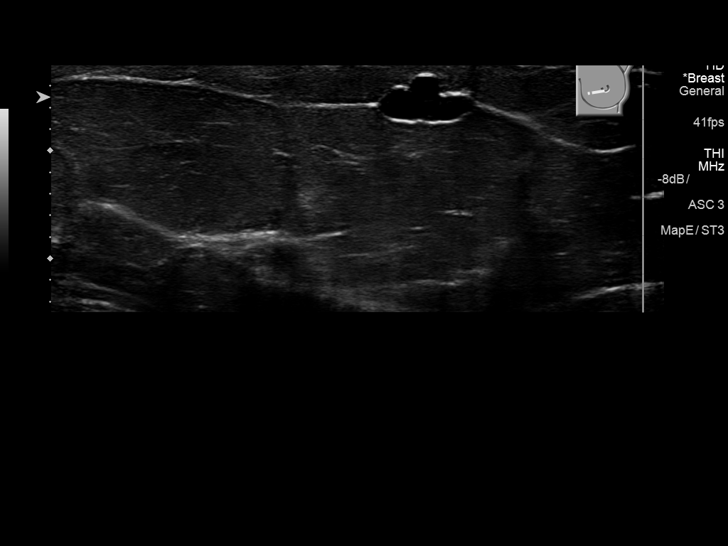
[im 3/13]
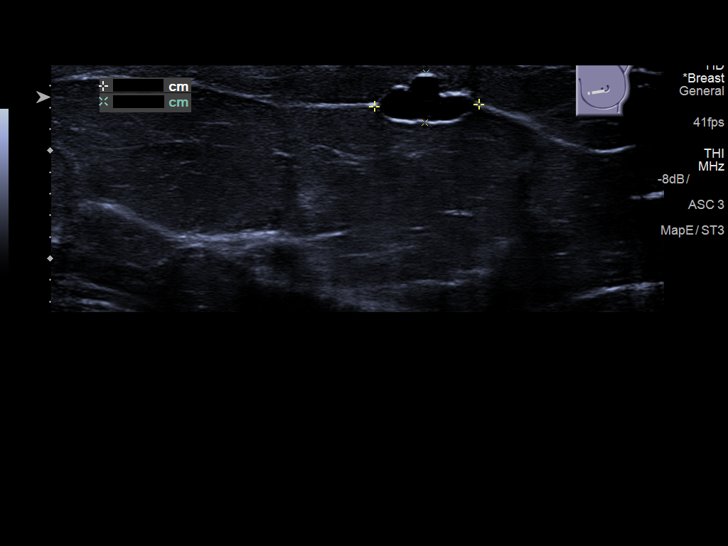
[im 4/13]
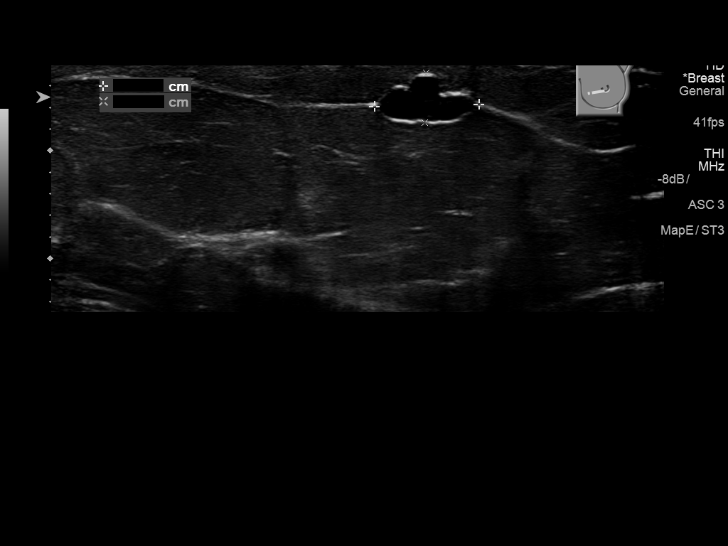
[im 5/13]
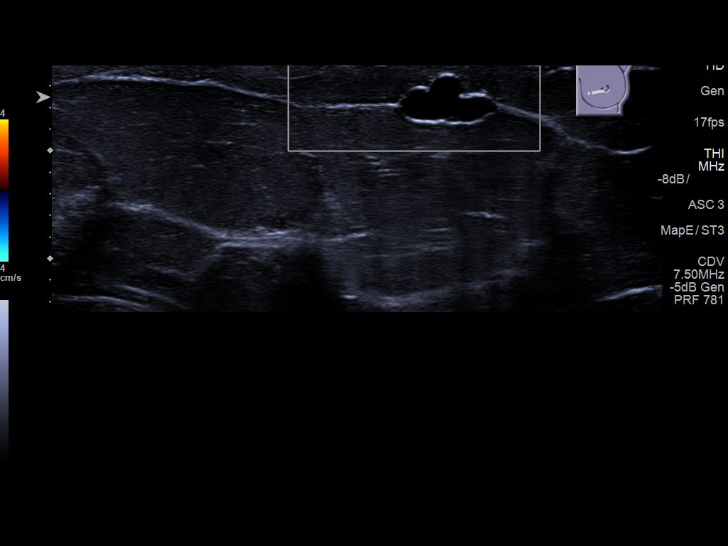
[im 6/13]
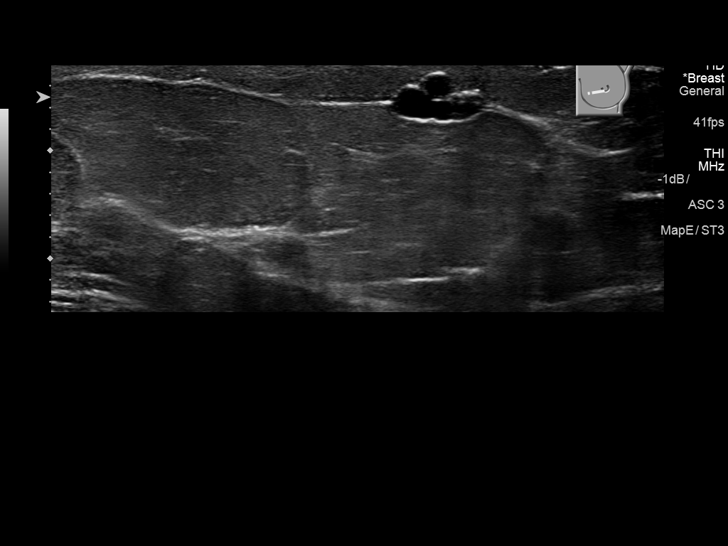
[im 7/13]
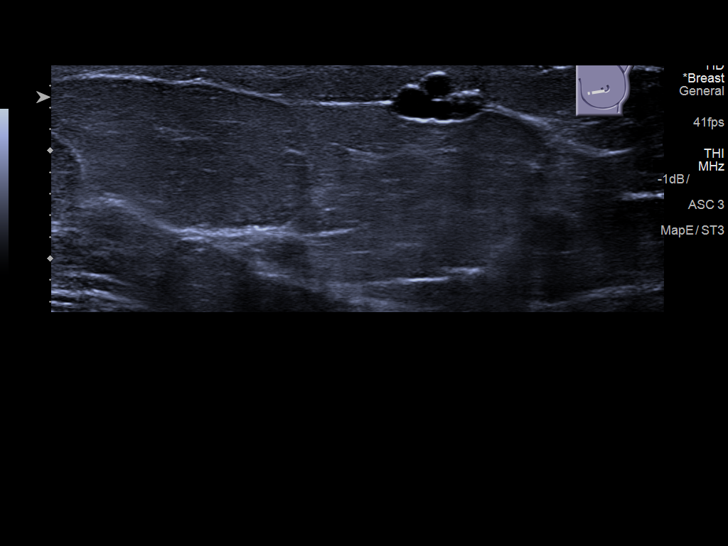
[im 8/13]
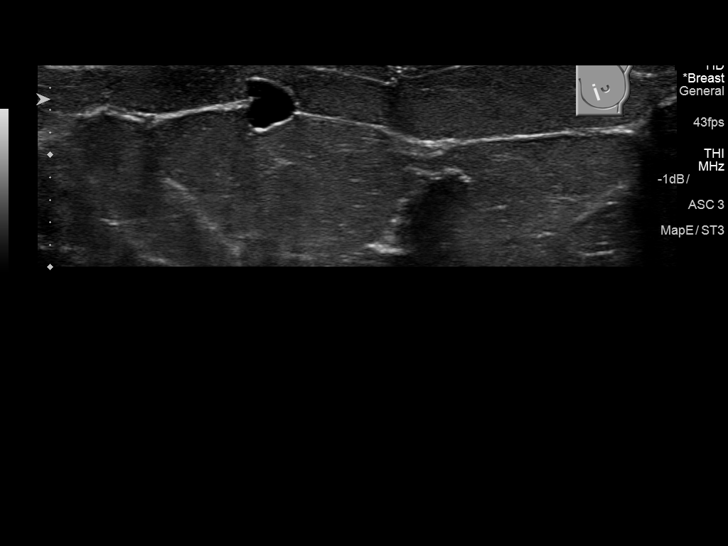
[im 9/13]
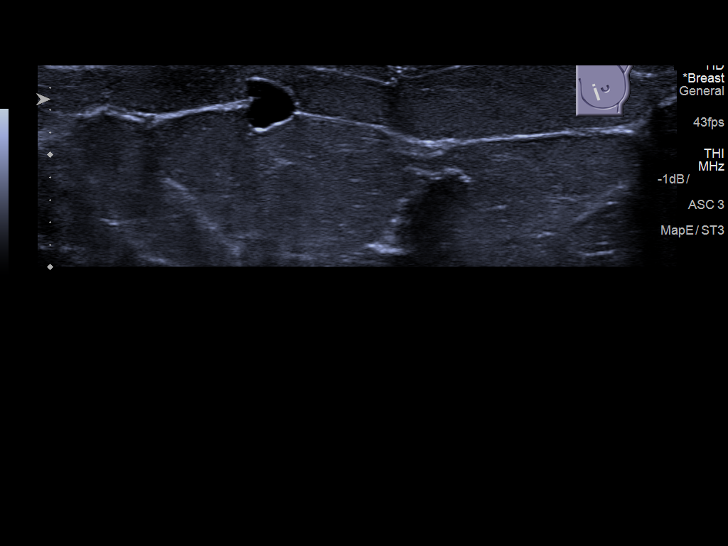
[im 10/13]
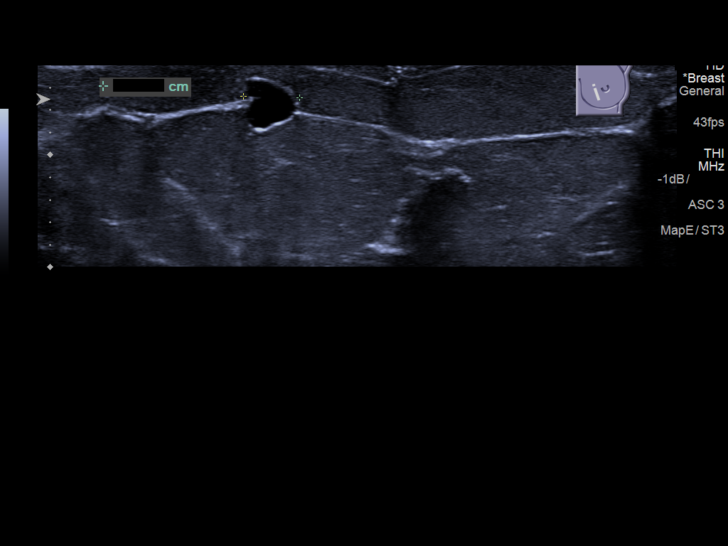
[im 11/13]
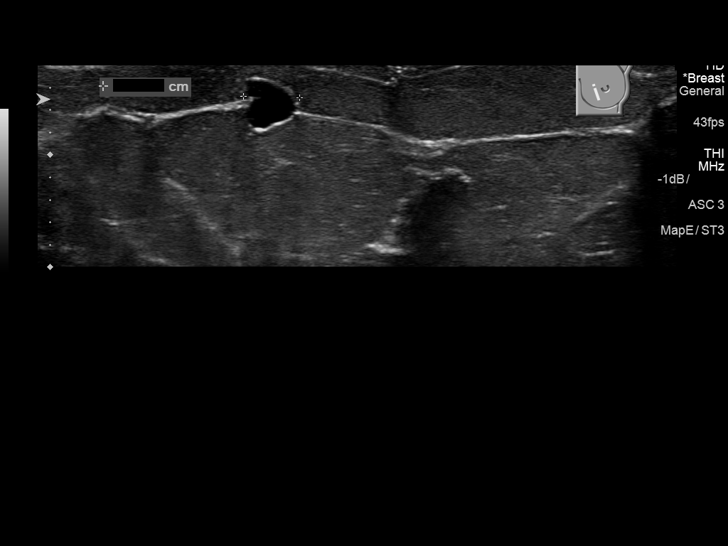
[im 12/13]
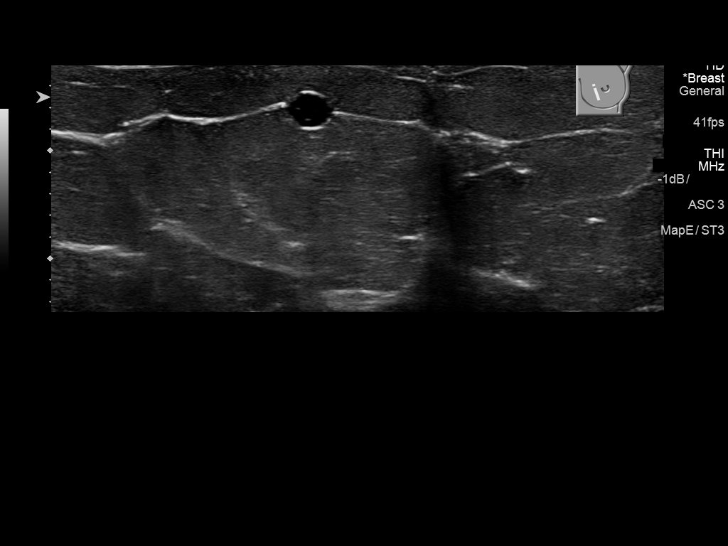
[im 13/13]
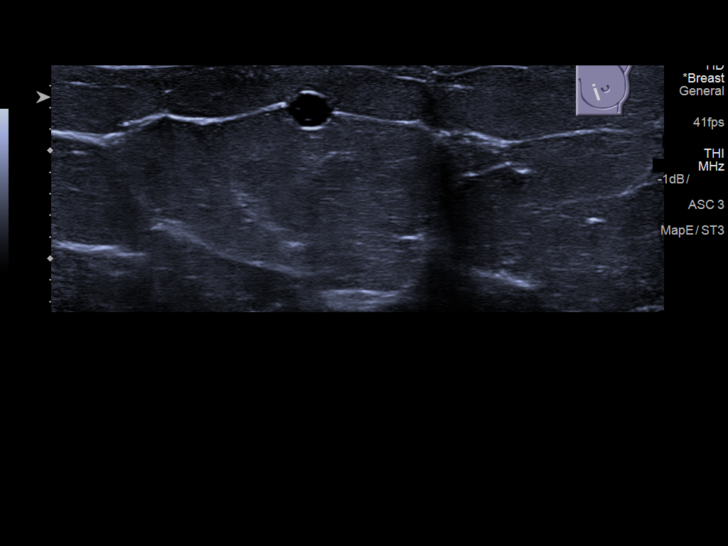

[13 of 13 positions shown; findings below may reference images not displayed]

ACR Breast Density Category c: The breast tissue is heterogeneously
dense, which may obscure small masses.
FINDINGS: On the additional views, there are multiple oval, circumscribed,
low-density masses within the area of concern in the lateral right
breast including 2 adjacent oval masses in the lower, outer right
breast together measuring 9 mm in extent. Within the lower, inner
left breast, there is a 9 mm oval, circumscribed mass, corresponding
to the finding seen on screening mammogram.

Mammographic images were processed with CAD.

Targeted ultrasound of the lower, inner left breast demonstrates a
cluster of cysts at 8 o'clock, 3 cm from the nipple measuring 10 x 5
x 5 mm, corresponding to the mammographic finding. No internal
vascularity is identified.

Targeted ultrasound of the lateral right breast demonstrates
multiple cysts including 2 adjacent cysts at 8 o'clock, 6 cm from
the nipple individually measuring 4 x 3 x 4 mm and 5 x 5 x 6 mm, and
together spanning 9 mm. An additional small cluster of cysts is seen
at 10 o'clock, 4 cm from nipple measuring 6 x 5 x 4 mm. A simple
cyst is seen at [DATE], 6 cm from the nipple measuring 5 x 3 x 3 mm.
The cysts correspond to the low-density oval, circumscribed masses
seen within the lateral right breast mammographically. No suspicious
sonographic finding is seen in the area of concern within the
lateral right breast.
IMPRESSION: Bilateral breast cysts. No mammographic or sonographic evidence of
malignancy.

RECOMMENDATION:
Screening mammogram in one year.(Code:AS-I-MET)

I have discussed the findings and recommendations with the patient.
Results were also provided in writing at the conclusion of the
visit. If applicable, a reminder letter will be sent to the patient
regarding the next appointment.

BI-RADS CATEGORY  2: Benign.

## 2018-07-26 ENCOUNTER — Other Ambulatory Visit
Admission: RE | Admit: 2018-07-26 | Discharge: 2018-07-26 | Disposition: A | Discharge status: Home / Self Care | Payer: BLUE CROSS/BLUE SHIELD | Source: Ambulatory Visit | Attending: Orthopedic Surgery | Admitting: Orthopedic Surgery

## 2018-07-26 DIAGNOSIS — M2550 Pain in unspecified joint: Secondary | ICD-10-CM | POA: Diagnosis present

## 2018-07-26 LAB — C-REACTIVE PROTEIN

## 2018-07-26 LAB — URIC ACID: Uric Acid, Serum: 5 mg/dL (ref 2.5–7.1)

## 2018-07-27 LAB — ANTISTREPTOLYSIN O TITER: ASO: 21 IU/mL (ref 0.0–200.0)

## 2018-07-27 LAB — CYCLIC CITRUL PEPTIDE ANTIBODY, IGG/IGA: CCP ANTIBODIES IGG/IGA: 11 U (ref 0–19)

## 2018-07-27 LAB — ANA W/REFLEX: Anti Nuclear Antibody(ANA): NEGATIVE

## 2019-09-06 ENCOUNTER — Other Ambulatory Visit: Payer: Self-pay | Admitting: Specialist

## 2019-09-06 DIAGNOSIS — Z1231 Encounter for screening mammogram for malignant neoplasm of breast: Secondary | ICD-10-CM

## 2020-02-20 ENCOUNTER — Other Ambulatory Visit: Payer: Self-pay | Admitting: Physician Assistant

## 2020-02-20 DIAGNOSIS — M7541 Impingement syndrome of right shoulder: Secondary | ICD-10-CM

## 2020-03-03 ENCOUNTER — Other Ambulatory Visit: Payer: Self-pay

## 2020-03-03 ENCOUNTER — Ambulatory Visit
Admission: RE | Admit: 2020-03-03 | Discharge: 2020-03-03 | Disposition: A | Discharge status: Home / Self Care | Payer: BC Managed Care – PPO | Source: Ambulatory Visit | Attending: Physician Assistant | Admitting: Physician Assistant

## 2020-03-03 DIAGNOSIS — M7541 Impingement syndrome of right shoulder: Secondary | ICD-10-CM | POA: Diagnosis present

## 2020-04-03 ENCOUNTER — Ambulatory Visit: Payer: BC Managed Care – PPO

## 2020-04-16 ENCOUNTER — Ambulatory Visit
Admission: RE | Admit: 2020-04-16 | Discharge: 2020-04-16 | Disposition: A | Discharge status: Home / Self Care | Payer: BC Managed Care – PPO | Source: Ambulatory Visit | Attending: Specialist | Admitting: Specialist

## 2020-04-16 ENCOUNTER — Other Ambulatory Visit: Payer: Self-pay

## 2020-04-16 DIAGNOSIS — Z1231 Encounter for screening mammogram for malignant neoplasm of breast: Secondary | ICD-10-CM | POA: Diagnosis not present

## 2020-04-19 ENCOUNTER — Inpatient Hospital Stay
Admission: RE | Admit: 2020-04-19 | Discharge: 2020-04-19 | Disposition: A | Discharge status: Home / Self Care | Payer: Self-pay | Source: Ambulatory Visit | Attending: *Deleted | Admitting: *Deleted

## 2020-04-19 ENCOUNTER — Other Ambulatory Visit: Payer: Self-pay | Admitting: *Deleted

## 2020-04-19 DIAGNOSIS — Z1231 Encounter for screening mammogram for malignant neoplasm of breast: Secondary | ICD-10-CM

## 2021-03-05 ENCOUNTER — Other Ambulatory Visit: Payer: Self-pay | Admitting: Specialist

## 2021-03-05 DIAGNOSIS — N6320 Unspecified lump in the left breast, unspecified quadrant: Secondary | ICD-10-CM

## 2021-03-18 ENCOUNTER — Other Ambulatory Visit: Payer: BC Managed Care – PPO

## 2021-03-25 ENCOUNTER — Ambulatory Visit: Admission: RE | Admit: 2021-03-25 | Payer: BC Managed Care – PPO | Source: Ambulatory Visit

## 2021-03-25 ENCOUNTER — Other Ambulatory Visit: Payer: BC Managed Care – PPO

## 2021-04-04 ENCOUNTER — Inpatient Hospital Stay: Admission: RE | Admit: 2021-04-04 | Payer: BC Managed Care – PPO | Source: Ambulatory Visit

## 2021-04-04 ENCOUNTER — Other Ambulatory Visit: Payer: BC Managed Care – PPO

## 2021-04-25 ENCOUNTER — Inpatient Hospital Stay: Admission: RE | Admit: 2021-04-25 | Payer: BC Managed Care – PPO | Source: Ambulatory Visit

## 2021-04-25 ENCOUNTER — Other Ambulatory Visit: Payer: BC Managed Care – PPO

## 2021-04-29 ENCOUNTER — Other Ambulatory Visit: Payer: Self-pay

## 2021-04-29 ENCOUNTER — Ambulatory Visit
Admission: RE | Admit: 2021-04-29 | Discharge: 2021-04-29 | Disposition: A | Discharge status: Home / Self Care | Payer: BC Managed Care – PPO | Source: Ambulatory Visit | Attending: Specialist | Admitting: Specialist

## 2021-04-29 DIAGNOSIS — N6320 Unspecified lump in the left breast, unspecified quadrant: Secondary | ICD-10-CM | POA: Insufficient documentation

## 2022-04-25 ENCOUNTER — Other Ambulatory Visit: Payer: Self-pay | Admitting: Family Medicine

## 2022-04-25 DIAGNOSIS — M5416 Radiculopathy, lumbar region: Secondary | ICD-10-CM

## 2022-04-28 ENCOUNTER — Other Ambulatory Visit: Payer: Self-pay | Admitting: Family Medicine

## 2022-04-28 DIAGNOSIS — M25511 Pain in right shoulder: Secondary | ICD-10-CM

## 2022-05-02 ENCOUNTER — Ambulatory Visit
Admission: RE | Admit: 2022-05-02 | Discharge: 2022-05-02 | Disposition: A | Discharge status: Home / Self Care | Payer: BC Managed Care – PPO | Source: Ambulatory Visit | Attending: Family Medicine | Admitting: Family Medicine

## 2022-05-02 DIAGNOSIS — M25511 Pain in right shoulder: Secondary | ICD-10-CM

## 2022-05-05 ENCOUNTER — Ambulatory Visit
Admission: RE | Admit: 2022-05-05 | Discharge: 2022-05-05 | Disposition: A | Discharge status: Home / Self Care | Payer: BC Managed Care – PPO | Source: Ambulatory Visit | Attending: Family Medicine | Admitting: Family Medicine

## 2022-05-05 DIAGNOSIS — M5416 Radiculopathy, lumbar region: Secondary | ICD-10-CM

## 2022-05-20 ENCOUNTER — Other Ambulatory Visit: Payer: Self-pay | Admitting: Physician Assistant

## 2022-05-20 DIAGNOSIS — M7541 Impingement syndrome of right shoulder: Secondary | ICD-10-CM

## 2022-05-27 ENCOUNTER — Other Ambulatory Visit: Payer: Self-pay | Admitting: Specialist

## 2022-05-27 DIAGNOSIS — Z1231 Encounter for screening mammogram for malignant neoplasm of breast: Secondary | ICD-10-CM

## 2022-05-28 ENCOUNTER — Inpatient Hospital Stay: Admission: RE | Admit: 2022-05-28 | Payer: BC Managed Care – PPO | Source: Ambulatory Visit

## 2022-06-04 ENCOUNTER — Ambulatory Visit
Admission: RE | Admit: 2022-06-04 | Discharge: 2022-06-04 | Disposition: A | Discharge status: Home / Self Care | Payer: BC Managed Care – PPO | Source: Ambulatory Visit | Attending: Physician Assistant | Admitting: Physician Assistant

## 2022-06-04 DIAGNOSIS — M7541 Impingement syndrome of right shoulder: Secondary | ICD-10-CM

## 2022-06-10 DIAGNOSIS — M75101 Unspecified rotator cuff tear or rupture of right shoulder, not specified as traumatic: Secondary | ICD-10-CM

## 2022-06-10 HISTORY — DX: Unspecified rotator cuff tear or rupture of right shoulder, not specified as traumatic: M75.101

## 2022-06-19 ENCOUNTER — Encounter
Admission: RE | Admit: 2022-06-19 | Discharge: 2022-06-19 | Disposition: A | Discharge status: Home / Self Care | Payer: BC Managed Care – PPO | Source: Ambulatory Visit | Attending: Orthopedic Surgery | Admitting: Orthopedic Surgery

## 2022-06-19 ENCOUNTER — Other Ambulatory Visit: Payer: Self-pay | Admitting: Orthopedic Surgery

## 2022-06-19 HISTORY — DX: Age-related osteoporosis without current pathological fracture: M81.0

## 2022-06-19 HISTORY — DX: Depression, unspecified: F32.A

## 2022-06-19 HISTORY — DX: Unspecified asthma, uncomplicated: J45.909

## 2022-06-19 NOTE — Patient Instructions (Addendum)
Your procedure is scheduled on: Friday, August 11 Report to the Registration Desk on the 1st floor of the [facility]. To find out your arrival time, please call [phone] between 1PM - 3PM on: Thursday, August 10 If your arrival time is 6:00 am, do not arrive prior to that time as the [facility] entrance doors do not open until 6:00 am.  REMEMBER: Instructions that are not followed completely may result in serious medical risk, up to and including death; or upon the discretion of your surgeon and anesthesiologist your surgery may need to be rescheduled.  Do not eat food after midnight the night before surgery.  No gum chewing, lozengers or hard candies.  You may however, drink CLEAR liquids up to 2 hours before you are scheduled to arrive for your surgery. Do not drink anything within 2 hours of your scheduled arrival time.  Clear liquids include: - water  - apple juice without pulp - gatorade (not RED colors) - black coffee or tea (Do NOT add milk or creamers to the coffee or tea) Do NOT drink anything that is not on this list.  In addition, your doctor has ordered for you to drink the provided  Ensure Pre-Surgery Clear Carbohydrate Drink  Drinking this carbohydrate drink up to two hours before surgery helps to reduce insulin resistance and improve patient outcomes. Please complete drinking 2 hours prior to scheduled arrival time.  TAKE THESE MEDICATIONS THE MORNING OF SURGERY WITH A SIP OF WATER:  Albuterol inhaler Clonazepam if needed for anxiety Escitalopram (Lexapro) Gabapentin  Use inhalers on the day of surgery and bring to the hospital.  One week prior to surgery: Stop Anti-inflammatories (NSAIDS) such as Advil, Aleve, Ibuprofen, Motrin, Naproxen, Naprosyn and Aspirin based products such as Excedrin, [place]ys Powder, BC Powder. Stop ANY OVER THE COUNTER supplements until after surgery. You may however, continue to take Tylenol if needed for pain up until the day of  surgery.  No Alcohol for 24 hours before or after surgery.  No Smoking including e-cigarettes for 24 hours prior to surgery.  No chewable tobacco products for at least 6 hours prior to surgery.  No nicotine patches on the day of surgery.  Do not use any "recreational" drugs for at least a week prior to your surgery.  Please be advised that the combination of cocaine and anesthesia may have negative outcomes, up to and including death. If you test positive for cocaine, your surgery will be cancelled.  On the morning of surgery brush your teeth with toothpaste and water, you may rinse your mouth with mouthwash if you wish. Do not swallow any toothpaste or mouthwash.  Use CHG Soap as directed on instruction sheet.  Do not wear jewelry, make-up, hairpins, clips or nail polish.  Do not wear lotions, powders, or perfumes.   Do not shave body from the neck down 48 hours prior to surgery just in case you cut yourself which could leave a site for infection.  Also, freshly shaved skin may become irritated if using the CHG soap.  Contact lenses, hearing aids and dentures may not be worn into surgery.  Do not bring valuables to the hospital. [facility] is not responsible for any missing/lost belongings or valuables.   Notify your doctor if there is any change in your medical condition (cold, fever, infection).  Wear comfortable clothing (specific to your surgery type) to the hospital.  After surgery, you can help prevent lung complications by doing breathing exercises.  Take  deep breaths and cough every 1-2 hours. Your doctor may order a device called an Incentive Spirometer to help you take deep breaths.  If you are being discharged the day of surgery, you will not be allowed to drive home. You will need a responsible adult (18 years or older) to drive you home and stay with you that night.   If you are taking public transportation, you will need to have a responsible adult (18 years or  older) with you. Please confirm with your physician that it is acceptable to use public transportation.   Please call the [facility] at [phone] if you have any questions about these instructions.  Surgery Visitation Policy:  Patients undergoing a surgery or procedure may have two family members or support persons with them as long as the person is not COVID-19 positive or experiencing its symptoms.   Preparing for Surgery with [place] (CHG) Soap    Before surgery, you can play an important role by reducing the number of germs on your skin.  CHG (Chlorhexidine gluconate) soap is an antiseptic cleanser which kills germs and bonds with the skin to continue killing germs even after washing.  Please do not use if you have an allergy to CHG or antibacterial soaps. If your skin becomes reddened/irritated stop using the CHG.  1. Shower the NIGHT BEFORE SURGERY and the MORNING OF SURGERY with CHG soap.  2. If you choose to wash your hair, wash your hair first as usual with your normal shampoo.  3. After shampooing, rinse your hair and body thoroughly to remove the shampoo.  4. Use CHG as you would any other liquid soap. You can apply CHG directly to the skin and wash gently with a scrungie or a clean washcloth.  5. Apply the CHG soap to your body only from the neck down. Do not use on open wounds or open sores. Avoid contact with your eyes, ears, mouth, and genitals (private parts). Wash face and genitals (private parts) with your normal soap.  6. Wash thoroughly, paying special attention to the area where your surgery will be performed.  7. Thoroughly rinse your body with warm water.  8. Do not shower/wash with your normal soap after using and rinsing off the CHG soap.  9. Pat yourself dry with a clean towel.  10. Wear clean pajamas to bed the night before surgery.  12. Place clean sheets on your bed the night of your first shower and do not  sleep with pets.  13. Shower again with the CHG soap on the day of surgery prior to arriving at the hospital.  14. Do not apply any deodorants/lotions/powders.  15. Please wear clean clothes to the hospital.

## 2022-06-20 ENCOUNTER — Other Ambulatory Visit: Payer: Self-pay

## 2022-06-20 ENCOUNTER — Ambulatory Visit: Payer: BC Managed Care – PPO | Admitting: Anesthesiology

## 2022-06-20 ENCOUNTER — Ambulatory Visit: Payer: BC Managed Care – PPO

## 2022-06-20 ENCOUNTER — Ambulatory Visit
Admission: RE | Admit: 2022-06-20 | Discharge: 2022-06-20 | Disposition: A | Discharge status: Home / Self Care | Payer: BC Managed Care – PPO | Source: Ambulatory Visit | Attending: Orthopedic Surgery | Admitting: Orthopedic Surgery

## 2022-06-20 ENCOUNTER — Encounter: Payer: Self-pay | Admitting: Orthopedic Surgery

## 2022-06-20 ENCOUNTER — Encounter
Admission: RE | Disposition: A | Discharge status: Home / Self Care | Payer: Self-pay | Source: Ambulatory Visit | Attending: Orthopedic Surgery

## 2022-06-20 DIAGNOSIS — M797 Fibromyalgia: Secondary | ICD-10-CM | POA: Diagnosis not present

## 2022-06-20 DIAGNOSIS — M25811 Other specified joint disorders, right shoulder: Secondary | ICD-10-CM | POA: Insufficient documentation

## 2022-06-20 DIAGNOSIS — M810 Age-related osteoporosis without current pathological fracture: Secondary | ICD-10-CM | POA: Diagnosis not present

## 2022-06-20 DIAGNOSIS — J45909 Unspecified asthma, uncomplicated: Secondary | ICD-10-CM | POA: Diagnosis not present

## 2022-06-20 DIAGNOSIS — F418 Other specified anxiety disorders: Secondary | ICD-10-CM | POA: Diagnosis not present

## 2022-06-20 DIAGNOSIS — F17200 Nicotine dependence, unspecified, uncomplicated: Secondary | ICD-10-CM | POA: Insufficient documentation

## 2022-06-20 DIAGNOSIS — M19011 Primary osteoarthritis, right shoulder: Secondary | ICD-10-CM | POA: Insufficient documentation

## 2022-06-20 DIAGNOSIS — M75111 Incomplete rotator cuff tear or rupture of right shoulder, not specified as traumatic: Secondary | ICD-10-CM | POA: Insufficient documentation

## 2022-06-20 HISTORY — PX: SHOULDER ARTHROSCOPY WITH ROTATOR CUFF REPAIR AND SUBACROMIAL DECOMPRESSION: SHX5686

## 2022-06-20 SURGERY — SHOULDER ARTHROSCOPY WITH ROTATOR CUFF REPAIR AND SUBACROMIAL DECOMPRESSION
Anesthesia: General | Laterality: Right

## 2022-06-20 MED ORDER — LACTATED RINGERS IV SOLN
INTRAVENOUS | Status: DC | PRN
  Administered 2022-06-20: 4 mL

## 2022-06-20 MED ORDER — MIDAZOLAM HCL 2 MG/2ML IJ SOLN
INTRAMUSCULAR | Status: AC
  Administered 2022-06-20: 1 mg via INTRAVENOUS
  Filled 2022-06-20: qty 2

## 2022-06-20 MED ORDER — ACETAMINOPHEN 10 MG/ML IV SOLN
INTRAVENOUS | Status: AC
  Filled 2022-06-20: qty 100

## 2022-06-20 MED ORDER — FENTANYL CITRATE (PF) 100 MCG/2ML IJ SOLN: 25.0000 ug | INTRAMUSCULAR | Status: DC | PRN

## 2022-06-20 MED ORDER — CEFAZOLIN SODIUM-DEXTROSE 2-4 GM/100ML-% IV SOLN
2.0000 g | INTRAVENOUS | Status: AC
  Administered 2022-06-20: 2 g via INTRAVENOUS

## 2022-06-20 MED ORDER — PHENYLEPHRINE HCL-NACL 20-0.9 MG/250ML-% IV SOLN
INTRAVENOUS | Status: DC | PRN
  Administered 2022-06-20: 50 ug/min via INTRAVENOUS

## 2022-06-20 MED ORDER — FENTANYL CITRATE (PF) 100 MCG/2ML IJ SOLN
INTRAMUSCULAR | Status: DC | PRN
  Administered 2022-06-20: 25 ug via INTRAVENOUS
  Administered 2022-06-20: 50 ug via INTRAVENOUS

## 2022-06-20 MED ORDER — FENTANYL CITRATE (PF) 100 MCG/2ML IJ SOLN
INTRAMUSCULAR | Status: AC
  Filled 2022-06-20: qty 2

## 2022-06-20 MED ORDER — ACETAMINOPHEN 10 MG/ML IV SOLN
INTRAVENOUS | Status: DC | PRN
  Administered 2022-06-20: 1000 mg via INTRAVENOUS

## 2022-06-20 MED ORDER — FENTANYL CITRATE PF 50 MCG/ML IJ SOSY: 50.0000 ug | PREFILLED_SYRINGE | Freq: Once | INTRAMUSCULAR | Status: AC

## 2022-06-20 MED ORDER — OXYCODONE HCL 5 MG/5ML PO SOLN: 5.0000 mg | Freq: Once | ORAL | Status: DC | PRN

## 2022-06-20 MED ORDER — FAMOTIDINE 20 MG PO TABS
ORAL_TABLET | ORAL | Status: AC
  Administered 2022-06-20: 20 mg via ORAL
  Filled 2022-06-20: qty 1

## 2022-06-20 MED ORDER — MIDAZOLAM HCL 2 MG/2ML IJ SOLN
INTRAMUSCULAR | Status: AC
Start: 2022-06-20 — End: ?
  Filled 2022-06-20: qty 2

## 2022-06-20 MED ORDER — FAMOTIDINE 20 MG PO TABS: 20.0000 mg | ORAL_TABLET | Freq: Once | ORAL | Status: AC

## 2022-06-20 MED ORDER — 0.9 % SODIUM CHLORIDE (POUR BTL) OPTIME
TOPICAL | Status: DC | PRN
  Administered 2022-06-20: 500 mL

## 2022-06-20 MED ORDER — LIDOCAINE HCL (PF) 2 % IJ SOLN
INTRAMUSCULAR | Status: AC
  Filled 2022-06-20: qty 5

## 2022-06-20 MED ORDER — ROCURONIUM BROMIDE 100 MG/10ML IV SOLN
INTRAVENOUS | Status: DC | PRN
  Administered 2022-06-20: 20 mg via INTRAVENOUS
  Administered 2022-06-20: 60 mg via INTRAVENOUS

## 2022-06-20 MED ORDER — CEFAZOLIN SODIUM-DEXTROSE 2-4 GM/100ML-% IV SOLN
INTRAVENOUS | Status: AC
  Filled 2022-06-20: qty 100

## 2022-06-20 MED ORDER — BUPIVACAINE HCL (PF) 0.5 % IJ SOLN
INTRAMUSCULAR | Status: AC
  Filled 2022-06-20: qty 10

## 2022-06-20 MED ORDER — EPHEDRINE SULFATE (PRESSORS) 50 MG/ML IJ SOLN
INTRAMUSCULAR | Status: DC | PRN
  Administered 2022-06-20: 10 mg via INTRAVENOUS
  Administered 2022-06-20: 5 mg via INTRAVENOUS
  Administered 2022-06-20: 10 mg via INTRAVENOUS

## 2022-06-20 MED ORDER — OXYCODONE HCL 5 MG PO TABS: 5.0000 mg | ORAL_TABLET | Freq: Once | ORAL | Status: DC | PRN

## 2022-06-20 MED ORDER — ONDANSETRON HCL 4 MG/2ML IJ SOLN
INTRAMUSCULAR | Status: DC | PRN
  Administered 2022-06-20: 4 mg via INTRAVENOUS

## 2022-06-20 MED ORDER — PROPOFOL 10 MG/ML IV BOLUS
INTRAVENOUS | Status: AC
  Filled 2022-06-20: qty 40

## 2022-06-20 MED ORDER — SUGAMMADEX SODIUM 200 MG/2ML IV SOLN
INTRAVENOUS | Status: DC | PRN
  Administered 2022-06-20: 200 mg via INTRAVENOUS

## 2022-06-20 MED ORDER — BUPIVACAINE LIPOSOME 1.3 % IJ SUSP
INTRAMUSCULAR | Status: AC
  Filled 2022-06-20: qty 10

## 2022-06-20 MED ORDER — OXYCODONE HCL 5 MG PO TABS: 5.0000 mg | ORAL_TABLET | ORAL | 0 refills | Status: DC | PRN

## 2022-06-20 MED ORDER — BUPIVACAINE HCL (PF) 0.5 % IJ SOLN
INTRAMUSCULAR | Status: DC | PRN
  Administered 2022-06-20: 10 mL via PERINEURAL

## 2022-06-20 MED ORDER — PROPOFOL 10 MG/ML IV BOLUS
INTRAVENOUS | Status: DC | PRN
  Administered 2022-06-20: 160 mg via INTRAVENOUS

## 2022-06-20 MED ORDER — RINGERS IRRIGATION IR SOLN
Status: DC | PRN
  Administered 2022-06-20: 6000 mL
  Administered 2022-06-20: 12000 mL
  Administered 2022-06-20: 6000 mL

## 2022-06-20 MED ORDER — LIDOCAINE HCL (CARDIAC) PF 100 MG/5ML IV SOSY
PREFILLED_SYRINGE | INTRAVENOUS | Status: DC | PRN
  Administered 2022-06-20: 60 mg via INTRAVENOUS

## 2022-06-20 MED ORDER — ROCURONIUM BROMIDE 10 MG/ML (PF) SYRINGE
PREFILLED_SYRINGE | INTRAVENOUS | Status: AC
Start: 2022-06-20 — End: ?
  Filled 2022-06-20: qty 10

## 2022-06-20 MED ORDER — ORAL CARE MOUTH RINSE: 15.0000 mL | Freq: Once | OROMUCOSAL | Status: AC

## 2022-06-20 MED ORDER — ONDANSETRON HCL 4 MG/2ML IJ SOLN
4.0000 mg | Freq: Once | INTRAMUSCULAR | Status: DC | PRN
Start: 2022-06-20 — End: 2022-06-20

## 2022-06-20 MED ORDER — BUPIVACAINE LIPOSOME 1.3 % IJ SUSP
INTRAMUSCULAR | Status: DC | PRN
  Administered 2022-06-20: 10 mL via PERINEURAL

## 2022-06-20 MED ORDER — FENTANYL CITRATE PF 50 MCG/ML IJ SOSY
PREFILLED_SYRINGE | INTRAMUSCULAR | Status: AC
  Administered 2022-06-20: 50 ug via INTRAVENOUS
  Filled 2022-06-20: qty 1

## 2022-06-20 MED ORDER — ACETAMINOPHEN 500 MG PO TABS: 1000.0000 mg | ORAL_TABLET | Freq: Three times a day (TID) | ORAL | 2 refills | Status: AC

## 2022-06-20 MED ORDER — CHLORHEXIDINE GLUCONATE 0.12 % MT SOLN
OROMUCOSAL | Status: AC
  Administered 2022-06-20: 15 mL via OROMUCOSAL
  Filled 2022-06-20: qty 15

## 2022-06-20 MED ORDER — CHLORHEXIDINE GLUCONATE 0.12 % MT SOLN: 15.0000 mL | Freq: Once | OROMUCOSAL | Status: AC

## 2022-06-20 MED ORDER — MIDAZOLAM HCL 2 MG/2ML IJ SOLN: 1.0000 mg | Freq: Once | INTRAMUSCULAR | Status: AC

## 2022-06-20 MED ORDER — ONDANSETRON 4 MG PO TBDP: 4.0000 mg | ORAL_TABLET | Freq: Three times a day (TID) | ORAL | 0 refills | Status: DC | PRN

## 2022-06-20 MED ORDER — DEXAMETHASONE SODIUM PHOSPHATE 10 MG/ML IJ SOLN
INTRAMUSCULAR | Status: DC | PRN
  Administered 2022-06-20: 6 mg via INTRAVENOUS

## 2022-06-20 MED ORDER — LACTATED RINGERS IV SOLN: INTRAVENOUS | Status: DC

## 2022-06-20 MED ORDER — ACETAMINOPHEN 10 MG/ML IV SOLN: 1000.0000 mg | Freq: Once | INTRAVENOUS | Status: DC | PRN

## 2022-06-20 MED ORDER — EPINEPHRINE PF 1 MG/ML IJ SOLN
INTRAMUSCULAR | Status: AC
  Filled 2022-06-20: qty 4

## 2022-06-20 MED ORDER — ASPIRIN 325 MG PO TBEC: 325.0000 mg | DELAYED_RELEASE_TABLET | Freq: Every day | ORAL | 0 refills | Status: AC

## 2022-06-20 SURGICAL SUPPLY — 88 items
ADAPTER IRRIG TUBE 2 SPIKE SOL (ADAPTER) ×5 IMPLANT
ADH SKN CLS APL DERMABOND .7 (GAUZE/BANDAGES/DRESSINGS)
ADPR TBG 2 SPK PMP STRL ASCP (ADAPTER) ×1
ANCH SUT 2 SWLK 19.1 CLS EYLT (Anchor) ×1 IMPLANT
ANCH SUT 2.9 PUSHLOCK ANCH (Orthopedic Implant) ×1 IMPLANT
ANCH SUT SWLK 19.1X5.5 CLS (Anchor) ×1 IMPLANT
ANCHOR 3.9 PEEK CORKSCREW 5MTS (Anchor) IMPLANT
ANCHOR SUT BIOC ST 3X145 (Anchor) IMPLANT
ANCHOR SWIVELOCK BIO 4.75X19.1 (Anchor) ×3 IMPLANT
ANCHOR SWIVELOCK BIO COMP (Anchor) ×1 IMPLANT
APL PRP STRL LF DISP 70% ISPRP (MISCELLANEOUS) ×2
BLADE SHAVER 4.5X7 STR FR (MISCELLANEOUS) ×3 IMPLANT
BNDG ADH 2 X3.75 FABRIC TAN LF (GAUZE/BANDAGES/DRESSINGS) ×3 IMPLANT
BNDG ADH XL 3.75X2 STRCH LF (GAUZE/BANDAGES/DRESSINGS) ×1
BUR BR 5.5 WIDE MOUTH (BURR) ×2 IMPLANT
CANNULA PART THRD DISP 5.75X7 (CANNULA) ×1 IMPLANT
CANNULA PARTIAL THREAD 2X7 (CANNULA) IMPLANT
CANNULA TWIST IN 8.25X9CM (CANNULA) ×1 IMPLANT
CHLORAPREP W/TINT 26 (MISCELLANEOUS) ×4 IMPLANT
COOLER POLAR GLACIER W/PUMP (MISCELLANEOUS) ×3 IMPLANT
DERMABOND ADVANCED (GAUZE/BANDAGES/DRESSINGS)
DERMABOND ADVANCED .7 DNX12 (GAUZE/BANDAGES/DRESSINGS) IMPLANT
DRAPE 3/4 80X56 (DRAPES) ×3 IMPLANT
DRAPE INCISE IOBAN 66X45 STRL (DRAPES) ×3 IMPLANT
DRAPE U-SHAPE 47X51 STRL (DRAPES) ×6 IMPLANT
DRSG TEGADERM 4X4.75 (GAUZE/BANDAGES/DRESSINGS) ×9 IMPLANT
ELECT REM PT RETURN 9FT ADLT (ELECTROSURGICAL) ×2
ELECTRODE REM PT RTRN 9FT ADLT (ELECTROSURGICAL) ×2 IMPLANT
GAUZE SPONGE 4X4 12PLY STRL (GAUZE/BANDAGES/DRESSINGS) ×3 IMPLANT
GAUZE XEROFORM 1X8 LF (GAUZE/BANDAGES/DRESSINGS) ×3 IMPLANT
GLOVE BIO SURGEON STRL SZ7.5 (GLOVE) ×3 IMPLANT
GLOVE BIOGEL PI IND STRL 8 (GLOVE) ×4 IMPLANT
GLOVE BIOGEL PI INDICATOR 8 (GLOVE) ×2
GLOVE SURG ORTHO 8.0 STRL STRW (GLOVE) ×3 IMPLANT
GLOVE SURG SYN 8.0 (GLOVE) ×2 IMPLANT
GLOVE SURG SYN 8.0 PF PI (GLOVE) ×2 IMPLANT
GOWN STRL REUS W/ TWL LRG LVL3 (GOWN DISPOSABLE) ×4 IMPLANT
GOWN STRL REUS W/TWL LRG LVL3 (GOWN DISPOSABLE) ×4
GOWN STRL REUS W/TWL XL LVL4 (GOWN DISPOSABLE) ×3 IMPLANT
IV LACTATED RINGER IRRG 3000ML (IV SOLUTION) ×20
IV LR IRRIG 3000ML ARTHROMATIC (IV SOLUTION) ×8 IMPLANT
KIT CORKSCREW KNTLS 3.9 S/T/P (INSTRUMENTS) IMPLANT
KIT STABILIZATION SHOULDER (MISCELLANEOUS) ×3 IMPLANT
KIT SUTURETAK 3.0 INSERT PERC (KITS) IMPLANT
KIT TURNOVER KIT A (KITS) ×3 IMPLANT
MANIFOLD NEPTUNE II (INSTRUMENTS) ×6 IMPLANT
MASK FACE SPIDER DISP (MASK) ×3 IMPLANT
MAT ABSORB  FLUID 56X50 GRAY (MISCELLANEOUS) ×4
MAT ABSORB FLUID 56X50 GRAY (MISCELLANEOUS) ×4 IMPLANT
NDL MAYO CATGUT SZ5 (NEEDLE)
NDL SAFETY ECLIPSE 18X1.5 (NEEDLE) ×2 IMPLANT
NDL SCORPION MULTI FIRE (NEEDLE) IMPLANT
NDL SUT 5 .5 CRC TPR PNT MAYO (NEEDLE) IMPLANT
NEEDLE HYPO 18GX1.5 SHARP (NEEDLE) ×2
NEEDLE SCORPION MULTI FIRE (NEEDLE) IMPLANT
PACK ARTHROSCOPY SHOULDER (MISCELLANEOUS) ×3 IMPLANT
PAD ABD DERMACEA PRESS 5X9 (GAUZE/BANDAGES/DRESSINGS) ×1 IMPLANT
PAD ARMBOARD 7.5X6 YLW CONV (MISCELLANEOUS) ×4 IMPLANT
PAD WRAPON POLAR SHDR XLG (MISCELLANEOUS) ×2 IMPLANT
PASSER SUT FIRSTPASS SELF (INSTRUMENTS) ×3 IMPLANT
PASSER SUT SWIFTSTITCH HIP CRT (INSTRUMENTS) ×2 IMPLANT
SHAVER BLADE BONE CUTTER  5.5 (BLADE)
SHAVER BLADE BONE CUTTER 5.5 (BLADE) ×2 IMPLANT
SLING ULTRA II M (MISCELLANEOUS) ×2 IMPLANT
SPONGE T-LAP 18X18 ~~LOC~~+RFID (SPONGE) ×3 IMPLANT
STAPLER SKIN PROX 35W (STAPLE) IMPLANT
STRAP SAFETY 5IN WIDE (MISCELLANEOUS) ×3 IMPLANT
SUT ETHILON 3-0 (SUTURE) ×3 IMPLANT
SUT LASSO 90 DEG CVD (SUTURE) IMPLANT
SUT LASSO 90 DEG SD STR (SUTURE) IMPLANT
SUT MNCRL 4-0 (SUTURE)
SUT MNCRL 4-0 27XMFL (SUTURE)
SUT PROLENE 0 CT 2 (SUTURE) IMPLANT
SUT TIGER TAPE 7 IN WHITE (SUTURE) ×1 IMPLANT
SUT VIC AB 0 CT1 36 (SUTURE) IMPLANT
SUT VIC AB 2-0 CT2 27 (SUTURE) IMPLANT
SUTURE MNCRL 4-0 27XMF (SUTURE) IMPLANT
SYSTEM FBRTK BICEPS 1.9 DRILL (Anchor) ×1 IMPLANT
SYSTEM IMPL TENODESIS LNT 2.9 (Orthopedic Implant) ×1 IMPLANT
TAPE CLOTH 3X10 WHT NS LF (GAUZE/BANDAGES/DRESSINGS) ×2 IMPLANT
TAPE MICROFOAM 4IN (TAPE) ×2 IMPLANT
TRAP FLUID SMOKE EVACUATOR (MISCELLANEOUS) ×3 IMPLANT
TUBING CONNECTING 10 (TUBING) IMPLANT
TUBING INFLOW SET DBFLO PUMP (TUBING) ×3 IMPLANT
TUBING OUTFLOW SET DBLFO PUMP (TUBING) ×3 IMPLANT
WAND WEREWOLF FLOW 90D (MISCELLANEOUS) ×4 IMPLANT
WATER STERILE IRR 500ML POUR (IV SOLUTION) ×2 IMPLANT
WRAPON POLAR PAD SHDR XLG (MISCELLANEOUS) ×2

## 2022-06-20 NOTE — Anesthesia Postprocedure Evaluation (Signed)
Anesthesia Post Note  Patient: Carola Viramontes  Procedure(s) Performed: Right shoulder arthroscopic vs mini-open rotator cuff repair, biceps tenodesis, distal clavicle excision, subacromial decompression (Right)  Patient location during evaluation: PACU Anesthesia Type: General Level of consciousness: awake and alert Pain management: pain level controlled Vital Signs Assessment: post-procedure vital signs reviewed and stable Respiratory status: spontaneous breathing, nonlabored ventilation, respiratory function stable and patient connected to nasal cannula oxygen Cardiovascular status: blood pressure returned to baseline and stable Postop Assessment: no apparent nausea or vomiting Anesthetic complications: no   No notable events documented.   Last Vitals:  Vitals:   06/20/22 1115 06/20/22 1129  BP: 129/70 121/68  Pulse: 80 76  Resp: 12 16  Temp: 36.8 C 36.7 C  SpO2: 92% 98%    Last Pain:  Vitals:   06/20/22 1129  TempSrc: Temporal  PainSc: 0-No pain                 Arita Miss

## 2022-06-20 NOTE — Discharge Instructions (Addendum)
Post-Op Instructions - Rotator Cuff Repair  1. Bracing: You will wear a shoulder immobilizer or sling for 4 weeks.   2. Driving: No driving for 4 weeks post-op. When driving, do not wear the immobilizer. Ideally, we recommend no driving for 6 weeks while sling is in place as one arm will be immobilized.   3. Activity: No active lifting for 2 months. Wrist, hand, and elbow motion only. Avoid lifting the upper arm away from the body except for hygiene. You are permitted to bend and straighten the elbow passively only (no active elbow motion). You may use your hand and wrist for typing, writing, and managing utensils (cutting food). Do not lift more than a coffee cup for 8 weeks.  When sleeping or resting, inclined positions (recliner chair or wedge pillow) and a pillow under the forearm for support may provide better comfort for up to 4 weeks.  Avoid long distance travel for 4 weeks.  Return to normal activities after rotator cuff repair repair normally takes 6 months on average. If rehab goes very well, may be able to do most activities at 4 months, except overhead or contact sports.  4. Physical Therapy: Begins 3-4 days after surgery, and proceed 1 time per week for the first 6 weeks, then 1-2 times per week from weeks 6-20 post-op.  5. Medications:  - You will be provided a prescription for narcotic pain medicine. After surgery, take 1-2 narcotic tablets every 4 hours if needed for severe pain.  - A prescription for anti-nausea medication will be provided in case the narcotic medicine causes nausea - take 1 tablet every 6 hours only if nauseated.   - Take tylenol 1000 mg (2 Extra Strength tablets or 3 regular strength) every 8 hours for pain.  May decrease or stop tylenol 5 days after surgery if you are having minimal pain. - Take ASA '325mg'$ /day x 2 weeks to help prevent DVTs/PEs (blood clots).  - DO NOT take ANY nonsteroidal anti-inflammatory pain medications (Advil, Motrin, Ibuprofen, Aleve,  Naproxen, or Naprosyn). These medicines can inhibit healing of your shoulder repair.    If you are taking prescription medication for anxiety, depression, insomnia, muscle spasm, chronic pain, or for attention deficit disorder, you are advised that you are at a higher risk of adverse effects with use of narcotics post-op, including narcotic addiction/dependence, depressed breathing, death. If you use non-prescribed substances: alcohol, marijuana, cocaine, heroin, methamphetamines, etc., you are at a higher risk of adverse effects with use of narcotics post-op, including narcotic addiction/dependence, depressed breathing, death. You are advised that taking > 50 morphine milligram equivalents (MME) of narcotic pain medication per day results in twice the risk of overdose or death. For your prescription provided: oxycodone 5 mg - taking more than 6 tablets per day would result in > 50 morphine milligram equivalents (MME) of narcotic pain medication. Be advised that we will prescribe narcotics short-term, for acute post-operative pain only - 3 weeks for major operations such as shoulder repair/reconstruction surgeries.     6. Post-Op Appointment:  Your first post-op appointment will be 10-14 days post-op.  7. Work or School: For most, but not all procedures, we advise staying out of work or school for at least 1 to 2 weeks in order to recover from the stress of surgery and to allow time for healing.   If you need a work or school note this can be provided.   8. Smoking: If you are a smoker, you need to refrain from  smoking in the postoperative period. The nicotine in cigarettes will inhibit healing of your shoulder repair and decrease the chance of successful repair. Similarly, nicotine containing products (gum, patches) should be avoided.   Post-operative Brace: Apply and remove the brace you received as you were instructed to at the time of fitting and as described in detail as the brace's  instructions for use indicate.  Wear the brace for the period of time prescribed by your physician.  The brace can be cleaned with soap and water and allowed to air dry only.  Should the brace result in increased pain, decreased feeling (numbness/tingling), increased swelling or an overall worsening of your medical condition, please contact your doctor immediately.  If an emergency situation occurs as a result of wearing the brace after normal business hours, please dial 911 and seek immediate medical attention.  Let your doctor know if you have any further questions about the brace issued to you. Refer to the shoulder sling instructions for use if you have any questions regarding the correct fit of your shoulder sling.  [facility]bles[facility]: [phone]  Video that illustrates how to properly use a shoulder sling: "Instructions for Proper Use of an Orthopaedic Sling" [email or web address]    AMBULATORY SURGERY  DISCHARGE INSTRUCTIONS   The drugs that you were given will stay in your system until tomorrow so for the next 24 hours you should not:  Drive an automobile Make any legal decisions Drink any alcoholic beverage   You may resume regular meals tomorrow.  Today it is better to start with liquids and gradually work up to solid foods.  You may eat anything you prefer, but it is better to start with liquids, then soup and crackers, and gradually work up to solid foods.   Please notify your doctor immediately if you have any unusual bleeding, trouble breathing, redness and pain at the surgery site, drainage, fever, or pain not relieved by medication.    Additional Instructions:        Please contact your physician with any problems or Same Day Surgery at [phone], Monday through Friday 6 am to 4 pm, or [place] at [facility] number at [phone].             Interscalene Nerve Block with Exparel   For your surgery  you have received an Interscalene Nerve Block with Exparel. Nerve Blocks affect many types of nerves, including nerves that control movement, pain and normal sensation.  You may experience feelings such as numbness, tingling, heaviness, weakness or the inability to move your arm or the feeling or sensation that your arm has "fallen asleep". A nerve block with Exparel can last up to 5 days.  Usually the weakness wears off first.  The tingling and heaviness usually wear off next.  Finally you may start to notice pain.  Keep in mind that this may occur in any order.  Once a nerve block starts to wear off it is usually completely gone within 60 minutes. ISNB may cause mild shortness of breath, a hoarse voice, blurry vision, unequal pupils, or drooping of the face on the same side as the nerve block.  These symptoms will usually resolve with the numbness.  Very rarely the procedure itself can cause mild seizures. If needed, your surgeon will give you a prescription for pain medication.  It will take about 60 minutes for the oral pain medication to become fully effective.  So, it is recommended that you start taking  this medication before the nerve block first begins to wear off, or when you first begin to feel discomfort. Take your pain medication only as prescribed.  Pain medication can cause sedation and decrease your breathing if you take more than you need for the level of pain that you have. Nausea is a common side effect of many pain medications.  You may want to eat something before taking your pain medicine to prevent nausea. After an Interscalene nerve block, you cannot feel pain, pressure or extremes in temperature in the effected arm.  Because your arm is numb it is at an increased risk for injury.  To decrease the possibility of injury, please practice the following:  While you are awake change the position of your arm frequently to prevent too much pressure on any one area for prolonged periods of  time.  If you have a cast or tight dressing, check the color or your fingers every couple of hours.  Call your surgeon with the appearance of any discoloration (white or blue). If you are given a sling to wear before you go home, please wear it  at all times until the block has completely worn off.  Do not get up at night without your sling. Please contact [facility] or your surgeon if you do not begin to regain sensation after 7 days from the surgery.  Anesthesia may be contacted by calling the Same Day Surgery Department, Mon. through Fri., 6 am to 4 pm at [phone].   If you experience any other problems or concerns, please contact your surgeon's office. If you experience severe or prolonged shortness of breath go to the nearest emergency department.

## 2022-06-20 NOTE — H&P (Signed)
Paper H&P to be scanned into permanent record. H&P reviewed. No significant changes noted.  

## 2022-06-20 NOTE — Anesthesia Procedure Notes (Signed)
Anesthesia Regional Block: Interscalene brachial plexus block  ? ?Pre-Anesthetic Checklist: , timeout performed,  Correct Patient, Correct Site, Correct Laterality,  Correct Procedure, Correct Position, site marked,  Risks and benefits discussed,  Surgical consent,  Pre-op evaluation,  At surgeon's request and post-op pain management ? ?Laterality: Right ? ?Prep: chloraprep     ?  ?Needles:  ?Injection technique: Single-shot ? ?Needle Type: Echogenic Needle   ? ? ?Needle Length: 4cm  ?Needle Gauge: 25  ? ? ? ?Additional Needles: ? ? ?Procedures:,,,, ultrasound used (permanent image in chart),,    ?Narrative:  ?Injection made incrementally with aspirations every 5 mL. ? ?Performed by: Personally  ?Anesthesiologist: Shawntell Dixson, MD ? ?Additional Notes: ?Patient's chart reviewed and they were deemed appropriate candidate for procedure, at surgeon's request. Patient educated about risks, benefits, and alternatives of the block including but not limited to: temporary or permanent nerve damage, bleeding, infection, damage to surround tissues, pneumothorax, hemidiaphragmatic paralysis, unilateral Horner's syndrome, block failure, local anesthetic toxicity. Patient expressed understanding. A formal time-out was conducted consistent with institution rules. ? ?Monitors were applied, and minimal sedation used (see nursing record). The site was prepped with skin prep and allowed to dry, and sterile gloves were used. A high frequency linear ultrasound probe with probe cover was utilized throughout. C5-7 nerve roots located and appeared anatomically normal, local anesthetic injected around them, and echogenic block needle trajectory was monitored throughout. Aspiration performed every 5ml. Lung and blood vessels were avoided. All injections were performed without resistance and free of blood and paresthesias. The patient tolerated the procedure well. ? ?Injectate: 10ml exparel + 10ml 0.5% bupivacaine  ? ? ? ?

## 2022-06-20 NOTE — Transfer of Care (Signed)
Immediate Anesthesia Transfer of Care Note  Patient: Julia Hoover  Procedure(s) Performed: Right shoulder arthroscopic vs mini-open rotator cuff repair, biceps tenodesis, distal clavicle excision, subacromial decompression (Right)  Patient Location: PACU  Anesthesia Type:General   Level of Consciousness: awake, alert  and oriented  Airway & Oxygen Therapy: Patient Spontanous Breathing  Post-op Assessment: Report given to RN and Post -op Vital signs reviewed and stable  Post vital signs: Reviewed and stable  Last Vitals:  Vitals Value Taken Time  BP 115/58 06/20/22 1032  Temp    Pulse 95 06/20/22 1035  Resp 19 06/20/22 1035  SpO2 93 % 06/20/22 1035  Vitals shown include unvalidated device data.  Last Pain:  Vitals:   06/20/22 0622  TempSrc: Temporal  PainSc: 3          Complications: No notable events documented.

## 2022-06-20 NOTE — Op Note (Signed)
SURGERY DATE: 06/20/2022   PRE-OP DIAGNOSIS:  1. Right subacromial impingement 2. Right biceps tendinopathy 3. Right rotator cuff tear 4. Right acromioclavicular joint arthritis  POST-OP DIAGNOSIS: 1. Right subacromial impingement 2. Right biceps tendinopathy 3. Right bursal sided partial-thickness rotator cuff tear 4. Right acromioclavicular joint arthritis  PROCEDURES:  1. Right arthroscopic rotator cuff repair 2. Right arthroscopic biceps tenodesis 3. Right arthroscopic subacromial decompression 4. Right arthroscopic extensive debridement of shoulder (glenohumeral and subacromial spaces) 5. Right arthroscopic distal clavicle excision  SURGEON: Cato Mulligan, MD   ANESTHESIA: Gen with Exparel interscalene block   ESTIMATED BLOOD LOSS: 5cc   DRAINS:  none   TOTAL IV FLUIDS: per anesthesia      SPECIMENS: none   IMPLANTS:  - Arthrex 2.22m PushLock x 1 - Arthrex 5.544mSwiveLock x 1    OPERATIVE FINDINGS:  Examination under anesthesia: A careful examination under anesthesia was performed.  Passive range of motion was: FF: 150; ER at side: 60; ER in abduction: 100; IR in abduction: 45.  Anterior load shift: NT.  Posterior load shift: NT.  Sulcus in neutral: NT.  Sulcus in ER: NT.     Intra-operative findings: A thorough arthroscopic examination of the shoulder was performed.  The findings are: 1. Biceps tendon: tendinopathy with significant erythema  2. Superior labrum: erythema with a type II SLAP tear 3. Posterior labrum and capsule: normal 4. Inferior capsule and inferior recess: normal 5. Glenoid cartilage surface: Focal Central grade 2 degenerative changes 6. Supraspinatus attachment: Bursal sided tear affecting approximately 50% of the footprint 7. Posterior rotator cuff attachment: normal 8. Humeral head articular cartilage: Approximately 10 x 7 mm area of grade 4 degenerative change 9. Rotator interval: significant synovitis 10: Subscapularis tendon: attachment  intact 11. Anterior labrum: Mildly degenerative 12. IGHL: normal   OPERATIVE REPORT:    Indications for procedure:  Julia Hoover a [age].o. female with over 2 months of right shoulder pain that began after she pulled a heavy garden hose.  She has had significant difficulty with daily activities and overhead motion since that time.  She has failed extensive nonoperative management consisting of medications, activity modifications, and corticosteroid injection. Clinical exam and MRI were suggestive of partial-thickness rotator cuff tear, biceps tendinopathy, acromioclavicular joint arthritis and subacromial impingement. After discussion of risks, benefits, and alternatives to surgery, the patient elected to proceed.    Procedure in detail:   I identified Julia Hoover the pre-operative holding area.  I marked the operative shoulder with my initials. I reviewed the risks and benefits of the proposed surgical intervention, and the patient wished to proceed.  Anesthesia was then performed with an Exparel interscalene block.  The patient was transferred to the operative suite and placed in the beach chair position.     Appropriate IV antibiotics were administered prior to incision. The operative upper extremity was then prepped and draped in standard fashion. A time out was performed confirming the correct extremity, correct patient, and correct procedure.    I then created a standard posterior portal with an 11 blade. The glenohumeral joint was easily entered with a blunt trocar and the arthroscope introduced. The findings of diagnostic arthroscopy are described above. I debrided degenerative tissue including the synovitic tissue about the rotator interval, anterior labrum, and superior labrum.  I also debrided the cartilaginous surfaces of the glenoid and humeral head with an oscillating shaver to ensure there were stable edges.  I then coagulated the inflamed synovium  to obtain  hemostasis and reduce the risk of post-operative swelling using an Arthrocare radiofrequency device.   I then turned my attention to the arthroscopic biceps tenodesis. The Loop n Tack technique was used to pass a FiberTape through the biceps in a locked fashion adjacent to the biceps anchor.  A hole for a 2.9 mm Arthrex PushLock was drilled in the bicipital groove just superior to the subscapularis tendon insertion.  The biceps tendon was then cut and the biceps anchor complex was debrided down to a stable base on the superior labrum. The FiberTape was loaded onto the PushLock anchor and impacted into place into the previously drilled hole in the bicipital groove.  This appropriately secured the biceps into the bicipital groove and took it off of tension.   Next, the arthroscope was then introduced into the subacromial space. A direct lateral portal was created with an 11-blade after spinal needle localization. An extensive subacromial bursectomy and debridement was performed using a combination of the shaver and Arthrocare wand. The entire acromial undersurface was exposed and the CA ligament was subperiosteally elevated to expose the anterior acromial hook. A burr was used to create a flat anterior and lateral aspect of the acromion, converting it from a Type 2 to a Type 1 acromion. Care was made to keep the deltoid fascia intact.   I then turned my attention to the arthroscopic distal clavicle excision. I identified the acromioclavicular joint. Surrounding bursal tissue was debrided and the edges of the joint were identified. I used the 5.17m barrel burr to remove the distal clavicle parallel to the edge of the acromion. I was able to fit two widths of the burr into the space between the distal clavicle and acromion, signifying that I had removed ~125mof distal clavicle. This was confirmed by viewing anteriorly and introducing a probe with measuring marks from the lateral portal. Hemostasis was achieved  with an Arthrocare wand. Fluid was evacuated from the shoulder.   Next I created an accessory posterolateral portal to assist with visualization and instrumentation.  I debrided the poor quality edges of the supraspinatus tendon.  I could not push a switching stick into the glenohumeral joint through the region of bursal sided tearing.  This confirmed partial-thickness tear.  I cleared the footprint seen on ArthroCare wand and prepared it to expose bleeding bone.    A FirstPass suture passer was used to place both ends of a FiberTape through the partial-thickness supraspinatus tear, spanning the anterior to posterior extent of the tear.  Both strains of suture were passed through an ArSurveyor, minerals I initially attempted to place a 4.75 mm anchor, but this did not achieve appropriate fixation due to the patient's bone quality.  Therefore, a 5.5 mm anchor was utilized.  This was placed approximately 2 cm distal to the lateral edge of the footprint in line with the midportion of the tear with appropriate tensioning of each suture prior to final fixation. This construct allowed for excellent reapproximation of the rotator cuff to its native footprint without undue tension.  Appropriate compression was achieved.  The repair was stable to external and internal rotation.   Fluid was evacuated from the shoulder, and the portals were closed with 3-0 Nylon. Xeroform was applied to the portals. A sterile dressing was applied, followed by a Polar Care sleeve and a SlingShot shoulder immobilizer/sling. The patient was awakened from anesthesia without difficulty and was transferred to the PACU in stable condition.    COMPLICATIONS:  none   DISPOSITION: plan for discharge home after recovery in PACU     POSTOPERATIVE PLAN: Remain in sling (except hygiene and elbow/wrist/hand RoM exercises as instructed by PT) x 4 weeks and NWB for this time. PT to begin 3-4 days after surgery.  Small/medium rotator cuff  repair rehab protocol. ASA '325mg'$  daily x 2 weeks for DVT ppx.

## 2022-06-20 NOTE — Anesthesia Preprocedure Evaluation (Signed)
Anesthesia Evaluation  Patient identified by MRN, date of birth, ID band Patient awake    Reviewed: Allergy & Precautions, NPO status , Patient's Chart, lab work & pertinent test results  History of Anesthesia Complications Negative for: history of anesthetic complications  Airway Mallampati: II  TM Distance: >3 FB Neck ROM: Full    Dental no notable dental hx. (+) Teeth Intact   Pulmonary asthma , neg sleep apnea, neg COPD, Current SmokerPatient did not abstain from smoking.,    Pulmonary exam normal breath sounds clear to auscultation       Cardiovascular Exercise Tolerance: Good METS(-) hypertension(-) CAD and (-) Past MI negative cardio ROS  (-) dysrhythmias  Rhythm:Regular Rate:Normal - Systolic murmurs    Neuro/Psych PSYCHIATRIC DISORDERS Anxiety Depression negative neurological ROS     GI/Hepatic neg GERD  ,(+)     (-) substance abuse  , Hepatitis -, B  Endo/Other  neg diabetes  Renal/GU negative Renal ROS     Musculoskeletal  (+) Arthritis , Fibromyalgia -  Abdominal   Peds  Hematology   Anesthesia Other Findings Past Medical History: No date: Anemia No date: Anxiety No date: Arthritis     Comment:  neck (noi limitations), fingers No date: Asthma No date: Constipation 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              none since. 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 No date: Menorrhagia No date: Numbness in both hands     Comment:  upon awakening, then goes away No date: Osteoporosis 06/2022: Rotator cuff tear, right No date: Scoliosis  Reproductive/Obstetrics                           Anesthesia Physical Anesthesia Plan  ASA: 2  Anesthesia Plan: General    Post-op Pain Management: Regional block* and Ofirmev IV (intra-op)*   Induction: Intravenous  PONV Risk Score and Plan: 2 and Ondansetron, Dexamethasone and Midazolam  Airway Management Planned: Oral ETT  Additional Equipment: None  Intra-op Plan:   Post-operative Plan: Extubation in OR  Informed Consent: I have reviewed the patients History and Physical, chart, labs and discussed the procedure including the risks, benefits and alternatives for the proposed anesthesia with the patient or authorized representative who has indicated his/her understanding and acceptance.     Dental advisory given  Plan Discussed with: CRNA and Surgeon  Anesthesia Plan Comments: (Discussed risks of anesthesia with patient, including PONV, sore throat, lip/dental/eye damage. Rare risks discussed as well, such as cardiorespiratory and neurological sequelae, and allergic reactions. Discussed the role of CRNA in patient's perioperative care. Patient understands. Patient counseled on benefits of smoking cessation, and increased perioperative risks associated with continued smoking. Discussed r/b/a of interscalene block, including elective nature. Risks discussed: - Rare: bleeding, infection, nerve damage - shortness of breath from hemidiaphragmatic paralysis - unilateral horner's syndrome - poor/non-working blocks - reactions and toxicity to local anesthetic Patient understands and agrees. )        Anesthesia Quick Evaluation

## 2022-06-20 NOTE — Anesthesia Procedure Notes (Signed)
Procedure Name: Intubation Date/Time: 06/20/2022 7:49 AM  Performed by: Yunuen Mordan, CRNAPre-anesthesia Checklist: Patient identified, Patient being monitored, Timeout performed, Emergency Drugs available and Suction available Patient Re-evaluated:Patient Re-evaluated prior to induction Oxygen Delivery Method: Circle system utilized Preoxygenation: Pre-oxygenation with 100% oxygen Induction Type: IV induction Ventilation: Mask ventilation without difficulty Laryngoscope Size: Mac and 3 Grade View: Grade I Tube type: Oral Number of attempts: 1 Placement Confirmation: ETT inserted through vocal cords under direct vision, positive ETCO2 and breath sounds checked- equal and bilateral Secured at: 20 cm Tube secured with: Tape Dental Injury: Teeth and Oropharynx as per pre-operative assessment

## 2022-07-16 ENCOUNTER — Other Ambulatory Visit: Payer: Self-pay | Admitting: Family Medicine

## 2022-07-16 DIAGNOSIS — M5412 Radiculopathy, cervical region: Secondary | ICD-10-CM

## 2022-07-30 ENCOUNTER — Ambulatory Visit
Admission: RE | Admit: 2022-07-30 | Discharge: 2022-07-30 | Disposition: A | Discharge status: Home / Self Care | Payer: BC Managed Care – PPO | Source: Ambulatory Visit | Attending: Family Medicine | Admitting: Family Medicine

## 2022-07-30 DIAGNOSIS — M5412 Radiculopathy, cervical region: Secondary | ICD-10-CM

## 2022-08-12 ENCOUNTER — Ambulatory Visit: Payer: BC Managed Care – PPO

## 2022-09-29 ENCOUNTER — Ambulatory Visit
Admission: RE | Admit: 2022-09-29 | Discharge: 2022-09-29 | Disposition: A | Discharge status: Home / Self Care | Payer: Self-pay | Source: Ambulatory Visit | Attending: Neurosurgery | Admitting: Neurosurgery

## 2022-09-29 ENCOUNTER — Other Ambulatory Visit: Payer: Self-pay

## 2022-09-29 DIAGNOSIS — Z049 Encounter for examination and observation for unspecified reason: Secondary | ICD-10-CM

## 2022-10-12 ENCOUNTER — Ambulatory Visit
Admission: RE | Admit: 2022-10-12 | Discharge: 2022-10-12 | Disposition: A | Discharge status: Home / Self Care | Payer: Medicare Other | Source: Ambulatory Visit | Attending: Family Medicine | Admitting: Family Medicine

## 2022-10-12 ENCOUNTER — Other Ambulatory Visit: Payer: Self-pay | Admitting: Family Medicine

## 2022-10-12 DIAGNOSIS — S20211A Contusion of right front wall of thorax, initial encounter: Secondary | ICD-10-CM

## 2022-10-13 NOTE — Progress Notes (Unsigned)
Referring Physician:  Harvest Dark, FNP [address] [place],   [address]  Primary Physician:  Danae Orleans, MD  History of Present Illness: 10/13/2022 Julia Hoover is here today with a chief complaint of ***  Neck pain that radiates into the right shoulder and arm?  Duration: ***June 2023 after pulling her water hose Location: *** Quality: *** Severity: *** 10/10 Precipitating: aggravated by ***cervical motion Modifying factors: made better by ***nothing Weakness: none Timing: ***constant Bowel/Bladder Dysfunction: none  Conservative measures:  Physical therapy: *** has participated in at [facility]? Discharged? Multimodal medical therapy including regular antiinflammatories: *** tylenol, gabapentin, tizanidine Injections: *** has received epidural steroid injections  08/07/2022: Right C5-6 transforaminal ESI (80% relief post injection flare good relief with prednisone)   Past Surgery: ***denies  Julia Hoover has ***no symptoms of cervical myelopathy.  The symptoms are causing a significant impact on the patient's life.   I have utilized the care everywhere function in epic to review the outside records available from external health systems.  Review of Systems:  A 10 point review of systems is negative, except for the pertinent positives and negatives detailed in the HPI.  Past Medical History: Past Medical History:  Diagnosis Date   Anemia    Anxiety    Arthritis    neck (noi limitations), fingers   Asthma    Constipation    Degenerative joint disease (DJD) of lumbar spine    Depression    Dysmenorrhea    Dysrhythmia    had irreg beat approx 12 years ago after taking ecstasy.  none since.   Fibroids    Fibromyalgia    Hepatitis    Hep B - as a child   Menorrhagia    Numbness in both hands    upon awakening, then goes away   Osteoporosis    Rotator cuff tear, right 06/2022   Scoliosis     Past Surgical  History: Past Surgical History:  Procedure Laterality Date   BREAST BIOPSY Right    neg   BREAST CYST ASPIRATION Right 2013   neg   CHOLECYSTECTOMY     KNEE SURGERY Right    thickening of bands   NOSE SURGERY     fracture as a child   SHOULDER ARTHROSCOPY WITH ROTATOR CUFF REPAIR AND SUBACROMIAL DECOMPRESSION Right 06/20/2022   Procedure: Right shoulder arthroscopic vs mini-open rotator cuff repair, biceps tenodesis, distal clavicle excision, subacromial decompression;  Surgeon: Leim Fabry, MD;  Location: [facility] ORS;  Service: Orthopedics;  Laterality: Right;   TONSILLECTOMY     TUBAL LIGATION     ULNAR COLLATERAL LIGAMENT RECONSTRUCTION Left 2009    Allergies: Allergies as of 10/14/2022 - Review Complete 06/20/2022  Allergen Reaction Noted   Latex Rash 06/14/2015    Medications: No outpatient medications have been marked as taking for the 10/14/22 encounter (Appointment) with Meade Maw, MD.    Social History: Social History   Tobacco Use   Smoking status: Every Day    Packs/day: 1.00    Years: 32.00    Total pack years: 32.00    Types: Cigarettes   Smokeless tobacco: Never  Vaping Use   Vaping Use: Former  Substance Use Topics   Alcohol use: Not Currently    Alcohol/week: 3.0 standard drinks of alcohol    Types: 3 Glasses of wine per week   Drug use: No    Family Medical History: Family History  Problem Relation Age of Onset  Breast cancer Mother 51   Hypertension Mother    Hyperlipidemia Mother    Breast cancer Cousin 92       pat cousin    Physical Examination: There were no vitals filed for this visit.  General: Patient is well developed, well nourished, calm, collected, and in no apparent distress. Attention to examination is appropriate.  Neck:   Supple.  Full range of motion.  Respiratory: Patient is breathing without any difficulty.   NEUROLOGICAL:     Awake, alert, oriented to person, place, and time.  Speech is clear and fluent.  Fund of knowledge is appropriate.   Cranial Nerves: Pupils equal round and reactive to light.  Facial tone is symmetric.  Facial sensation is symmetric. Shoulder shrug is symmetric. Tongue protrusion is midline.  There is no pronator drift.  ROM of spine: full.    Strength: Side Biceps Triceps Deltoid Interossei Grip Wrist Ext. Wrist Flex.  R '5 5 5 5 5 5 5  '$ L '5 5 5 5 5 5 5   '$ Side Iliopsoas Quads Hamstring PF DF EHL  R '5 5 5 5 5 5  '$ L '5 5 5 5 5 5   '$ Reflexes are ***2+ and symmetric at the biceps, triceps, brachioradialis, patella and achilles.   Hoffman's is absent.   Bilateral upper and lower extremity sensation is intact to light touch.    No evidence of dysmetria noted.  Gait is normal.     Medical Decision Making  Imaging: ***  I have personally reviewed the images and agree with the above interpretation.  Assessment and Plan: Ms. Julia Hoover is a pleasant 56 y.o. female with ***    Thank you for involving me in the care of this patient.      Tremel Setters K. Izora Ribas MD, [facility] Neurosurgery

## 2022-10-14 ENCOUNTER — Ambulatory Visit (INDEPENDENT_AMBULATORY_CARE_PROVIDER_SITE_OTHER): Payer: Medicare Other | Admitting: Neurosurgery

## 2022-10-14 ENCOUNTER — Encounter: Payer: Self-pay | Admitting: Neurosurgery

## 2022-10-14 VITALS — BP 118/70 | Ht 62.0 in | Wt 153.0 lb

## 2022-10-14 DIAGNOSIS — M79601 Pain in right arm: Secondary | ICD-10-CM

## 2022-10-14 DIAGNOSIS — M542 Cervicalgia: Secondary | ICD-10-CM

## 2022-10-21 ENCOUNTER — Other Ambulatory Visit: Payer: Self-pay | Admitting: Physician Assistant

## 2022-10-21 DIAGNOSIS — N63 Unspecified lump in unspecified breast: Secondary | ICD-10-CM

## 2022-11-07 ENCOUNTER — Inpatient Hospital Stay: Admission: RE | Admit: 2022-11-07 | Payer: BC Managed Care – PPO | Source: Ambulatory Visit

## 2022-11-07 ENCOUNTER — Other Ambulatory Visit: Payer: BC Managed Care – PPO

## 2022-11-21 ENCOUNTER — Ambulatory Visit
Admission: RE | Admit: 2022-11-21 | Discharge: 2022-11-21 | Disposition: A | Discharge status: Home / Self Care | Payer: Medicare Other | Source: Ambulatory Visit | Attending: Physician Assistant | Admitting: Physician Assistant

## 2022-11-21 DIAGNOSIS — N6325 Unspecified lump in the left breast, overlapping quadrants: Secondary | ICD-10-CM | POA: Insufficient documentation

## 2022-11-21 DIAGNOSIS — N63 Unspecified lump in unspecified breast: Secondary | ICD-10-CM

## 2022-12-01 ENCOUNTER — Other Ambulatory Visit: Payer: Self-pay | Admitting: Family Medicine

## 2022-12-01 DIAGNOSIS — R2242 Localized swelling, mass and lump, left lower limb: Secondary | ICD-10-CM

## 2022-12-01 DIAGNOSIS — Z87898 Personal history of other specified conditions: Secondary | ICD-10-CM

## 2022-12-01 DIAGNOSIS — K769 Liver disease, unspecified: Secondary | ICD-10-CM

## 2022-12-02 IMAGING — MG DIGITAL DIAGNOSTIC BILAT W/ TOMO W/ CAD
6 of 10 series · 6 of 30 positions shown · non-contrast
Comparison: Previous exam(s).

CLINICAL DATA: 55-year-old female presenting with two new adjacent
lumps and soreness in the left breast for several months.

EXAM:
DIGITAL DIAGNOSTIC BILATERAL MAMMOGRAM WITH TOMOSYNTHESIS AND CAD;
ULTRASOUND LEFT BREAST LIMITED
TECHNIQUE: Bilateral digital diagnostic mammography and breast tomosynthesis
was performed. The images were evaluated with computer-aided
detection.; Targeted ultrasound examination of the left breast was
performed

[L MLO synth-2D]
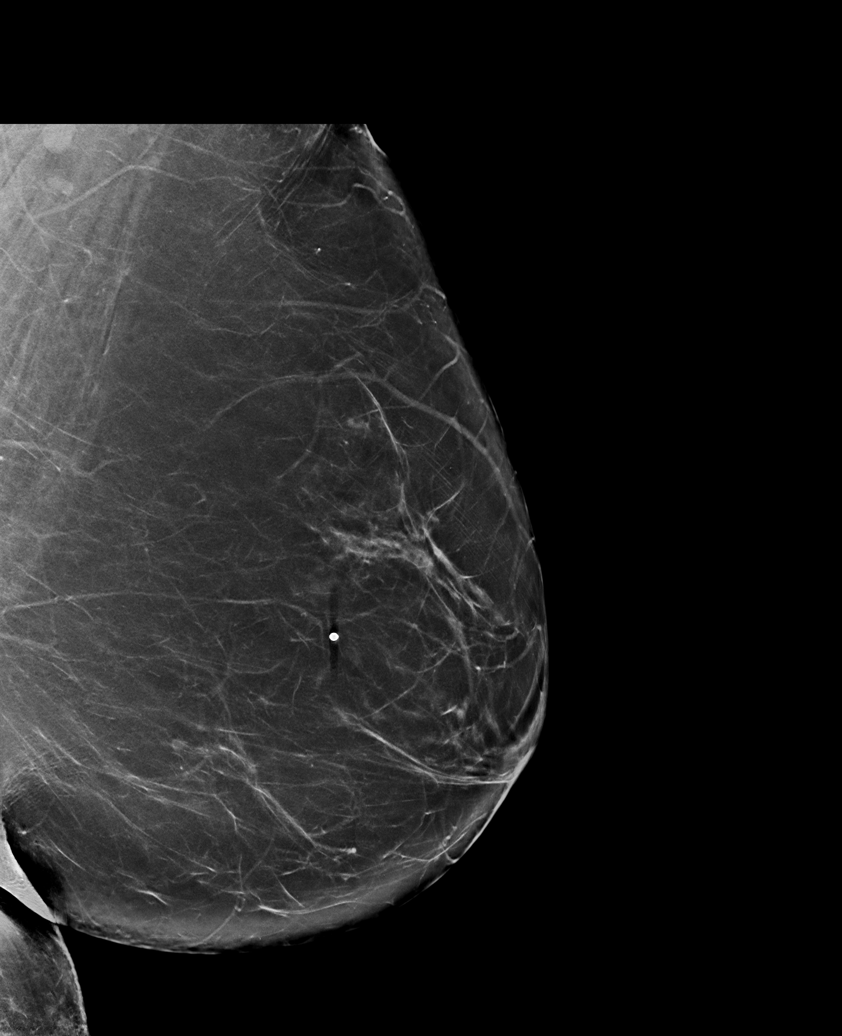

[R MLO synth-2D]
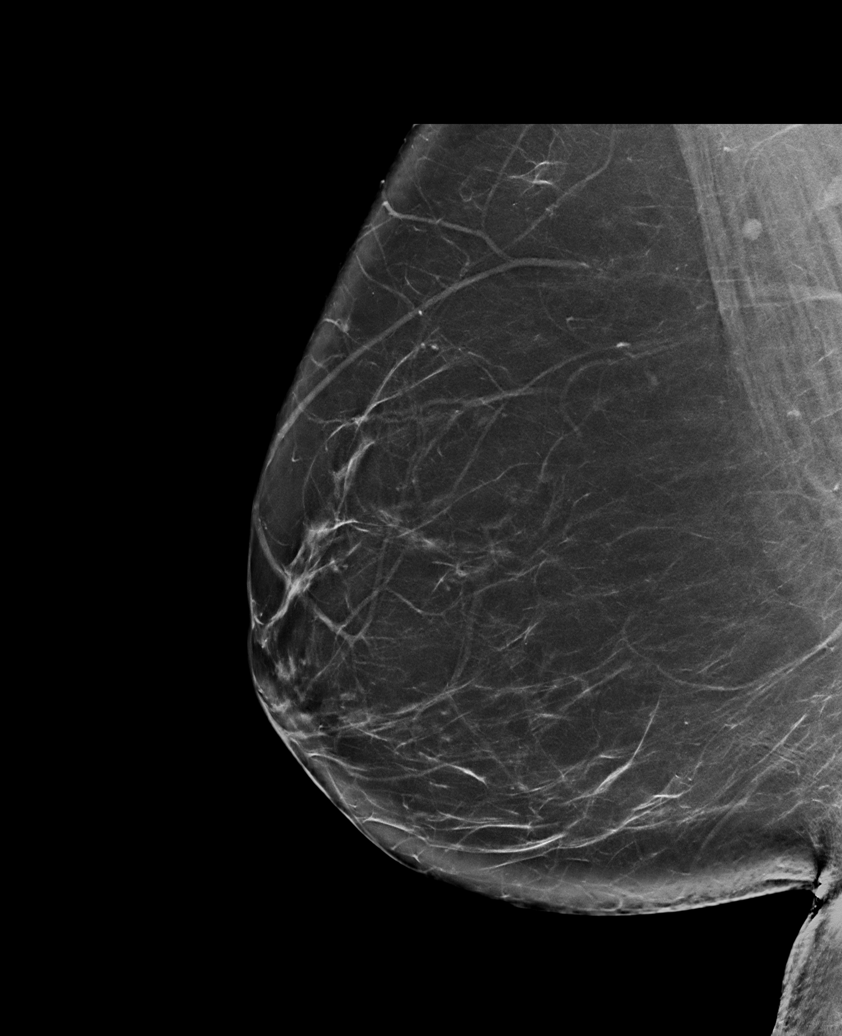

[R CC synth-2D]
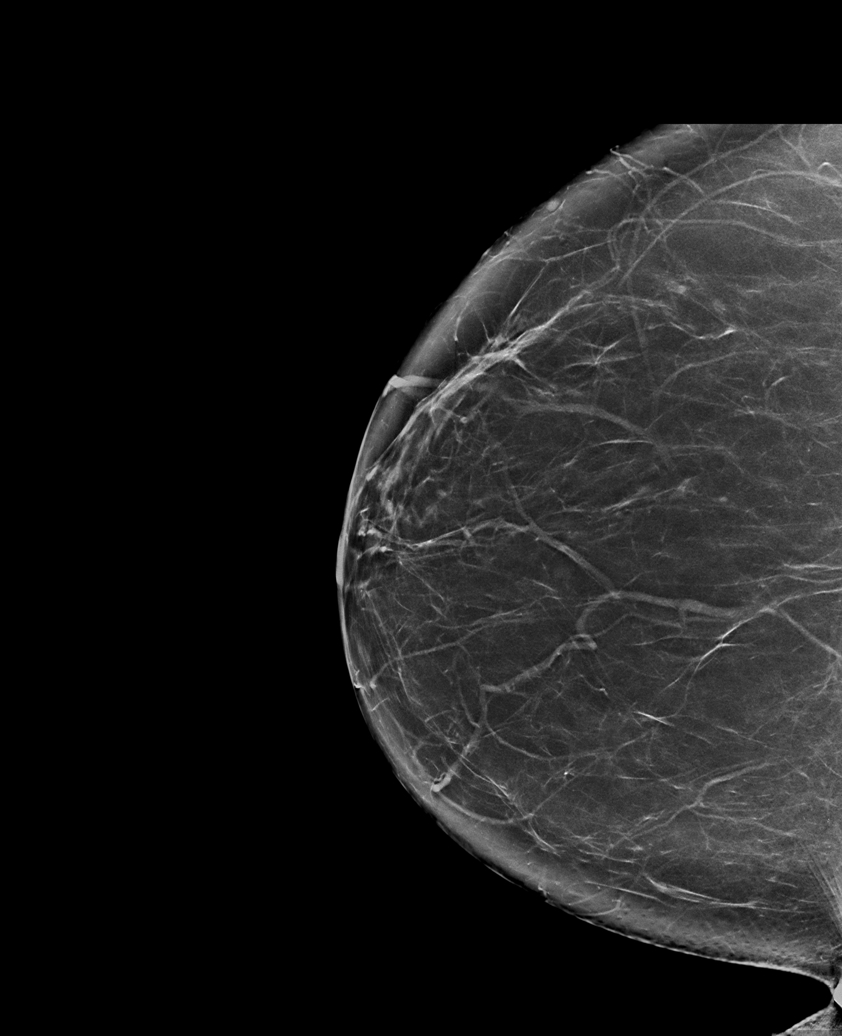

[L CC synth-2D (1 of 2)]
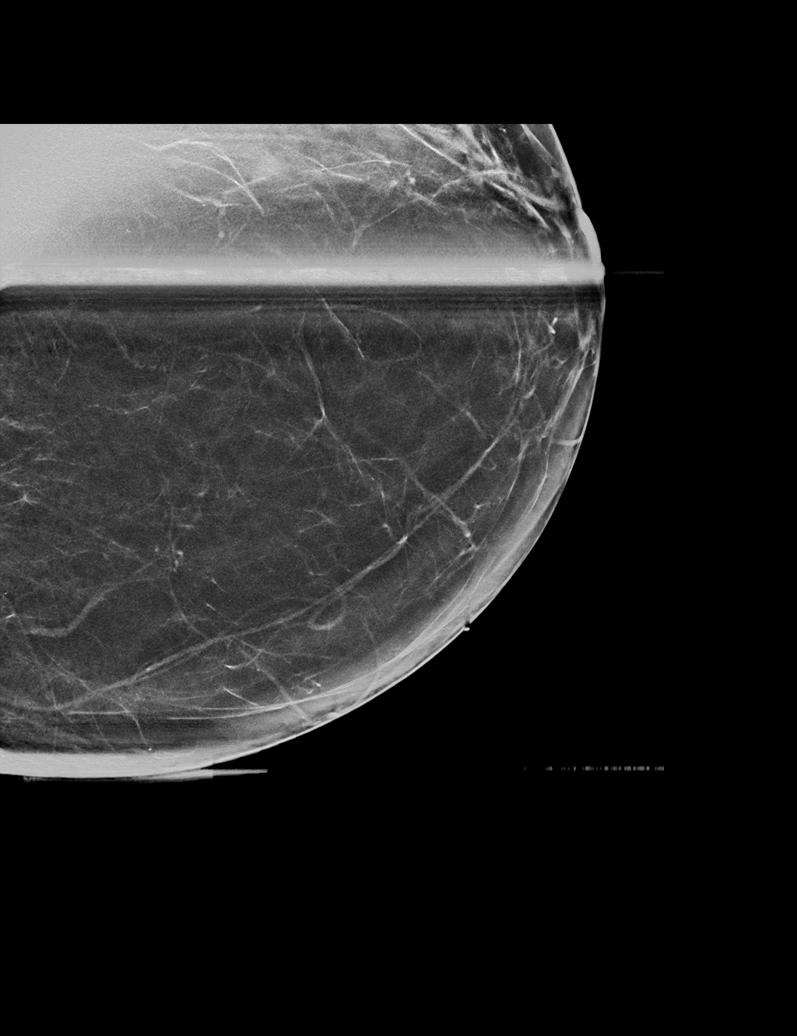

[L CC synth-2D (2 of 2)]
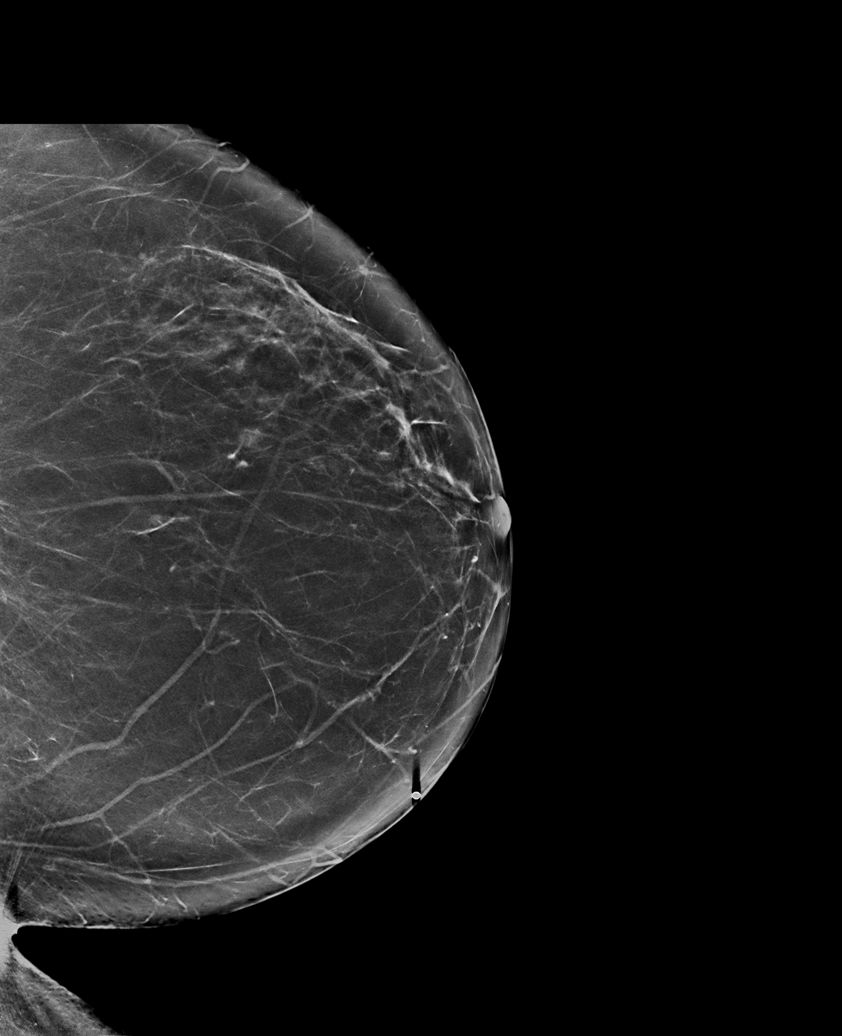

[L MLO tomo · tomo slice 45/90.0]
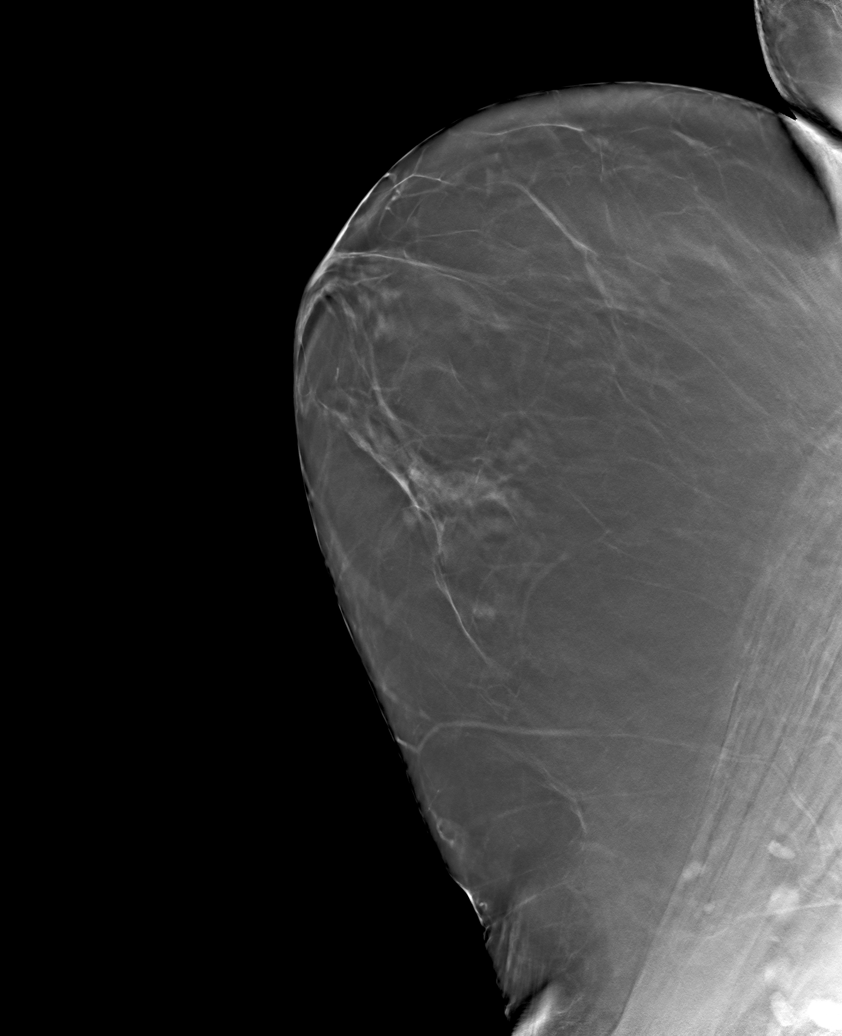

[6 of 30 positions shown; findings below may reference images not displayed]

ACR Breast Density Category b: There are scattered areas of
fibroglandular density.
FINDINGS: Mammogram:

Right breast: No suspicious mass, distortion, or microcalcifications
are identified to suggest presence of malignancy.

Left breast: A skin BB marks the site of concern reported by the
patient in the medial left breast. A spot tangential view of this
area was performed in addition to standard views. There is no new
abnormality at the palpable site or elsewhere in the left breast.

On physical exam at the site of concern reported by the patient I
feel a ridge of tissue without a fixed discrete mass.

Ultrasound:

Targeted ultrasound performed at the site of concern reported by the
patient in the left breast at 9:30 o'clock 8 cm from the nipple
demonstrating no cystic or solid mass.
IMPRESSION: 1. No mammographic or sonographic evidence of malignancy at the
palpable site of concern in the left breast.

2.  No mammographic evidence of malignancy in the right breast.

RECOMMENDATION:
1. Recommend any further workup of the palpable site in the left
breast be on a clinical basis.

2.  Screening mammogram in one year.(Code:[id number])

I have discussed the findings and recommendations with the patient.
If applicable, a reminder letter will be sent to the patient
regarding the next appointment.

BI-RADS CATEGORY  1: Negative.

## 2022-12-05 ENCOUNTER — Ambulatory Visit
Admission: RE | Admit: 2022-12-05 | Discharge: 2022-12-05 | Disposition: A | Discharge status: Home / Self Care | Payer: Medicare Other | Source: Ambulatory Visit | Attending: Family Medicine | Admitting: Family Medicine

## 2022-12-05 DIAGNOSIS — Z87898 Personal history of other specified conditions: Secondary | ICD-10-CM | POA: Diagnosis present

## 2022-12-05 DIAGNOSIS — R2242 Localized swelling, mass and lump, left lower limb: Secondary | ICD-10-CM

## 2022-12-05 DIAGNOSIS — K769 Liver disease, unspecified: Secondary | ICD-10-CM | POA: Diagnosis present

## 2023-01-06 ENCOUNTER — Other Ambulatory Visit: Payer: Self-pay | Admitting: *Deleted

## 2023-01-06 ENCOUNTER — Telehealth: Payer: Self-pay | Admitting: *Deleted

## 2023-01-06 DIAGNOSIS — Z122 Encounter for screening for malignant neoplasm of respiratory organs: Secondary | ICD-10-CM

## 2023-01-06 DIAGNOSIS — F17210 Nicotine dependence, cigarettes, uncomplicated: Secondary | ICD-10-CM

## 2023-01-06 DIAGNOSIS — Z87891 Personal history of nicotine dependence: Secondary | ICD-10-CM

## 2023-01-06 NOTE — Telephone Encounter (Signed)
Spoke with patient Scheduled SDMV 02/03/2023 3:30pm CT scheduled 02/04/2023 11:30am MCM Pt voiced understanding and had no further questions.

## 2023-01-13 ENCOUNTER — Ambulatory Visit: Payer: Medicare Other | Admitting: Neurosurgery

## 2023-02-03 ENCOUNTER — Encounter: Payer: Self-pay | Admitting: Acute Care

## 2023-02-03 ENCOUNTER — Ambulatory Visit (INDEPENDENT_AMBULATORY_CARE_PROVIDER_SITE_OTHER): Payer: Medicare Other | Admitting: Acute Care

## 2023-02-03 DIAGNOSIS — F17210 Nicotine dependence, cigarettes, uncomplicated: Secondary | ICD-10-CM

## 2023-02-03 NOTE — Patient Instructions (Signed)
Thank you for participating in the [facility] Screening Program. It was our pleasure to meet you today. We will call you with the results of your scan within the next few days. Your scan will be assigned a Lung RADS category score by the physicians reading the scans.  This Lung RADS score determines follow up scanning.  See below for description of categories, and follow up screening recommendations. We will be in touch to schedule your follow up screening annually or based on recommendations of our providers. We will fax a copy of your scan results to your Primary Care Physician, or the physician who referred you to the program, to ensure they have the results. Please call the office if you have any questions or concerns regarding your scanning experience or results.  Our office number is [phone]. Please speak with Denise Phelps, RN. , or  Denise Buckner RN, They are  our Lung Cancer Screening RN.'s If They are unavailable when you call, Please leave a message on the voice mail. We will return your call at our earliest convenience.This voice mail is monitored several times a day.  Remember, if your scan is normal, we will scan you annually as long as you continue to meet the criteria for the program. (Age 50-80, Current smoker or smoker who has quit within the last 15 years). If you are a smoker, remember, quitting is the single most powerful action that you can take to decrease your risk of lung cancer and other pulmonary, breathing related problems. We know quitting is hard, and we are here to help.  Please let us know if there is anything we can do to help you meet your goal of quitting. If you are a former smoker, congratulations. We are proud of you! Remain smoke free! Remember you can refer friends or family members through the number above.  We will screen them to make sure they meet criteria for the program. Thank you for helping us take better care of you by  participating in Lung Screening.  You can receive free nicotine replacement therapy ( patches, gum or mints) by calling 1-800-QUIT NOW. Please call so we can get you on the path to becoming  a non-smoker. I know it is hard, but you can do this!  Lung RADS Categories:  Lung RADS 1: no nodules or definitely non-concerning nodules.  Recommendation is for a repeat annual scan in 12 months.  Lung RADS 2:  nodules that are non-concerning in appearance and behavior with a very low likelihood of becoming an active cancer. Recommendation is for a repeat annual scan in 12 months.  Lung RADS 3: nodules that are probably non-concerning , includes nodules with a low likelihood of becoming an active cancer.  Recommendation is for a 6-month repeat screening scan. Often noted after an upper respiratory illness. We will be in touch to make sure you have no questions, and to schedule your 6-month scan.  Lung RADS 4 A: nodules with concerning findings, recommendation is most often for a follow up scan in 3 months or additional testing based on our provider's assessment of the scan. We will be in touch to make sure you have no questions and to schedule the recommended 3 month follow up scan.  Lung RADS 4 B:  indicates findings that are concerning. We will be in touch with you to schedule additional diagnostic testing based on our provider's  assessment of the scan.  Other options for assistance in smoking cessation (   As covered by your insurance benefits)  Hypnosis for smoking cessation  [employer] [phone]  Acupuncture for smoking cessation  [facility] [phone]   

## 2023-02-03 NOTE — Progress Notes (Signed)
Virtual Visit via Telephone Note  I connected with Fleeta Ames on 02/03/23 at  3:30 PM EDT by telephone and verified that I am speaking with the correct person using two identifiers.  Location: Patient:  At home  Provider: [address], [place], [place], [address]    I discussed the limitations, risks, security and privacy concerns of performing an evaluation and management service by telephone and the availability of in person appointments. I also discussed with the patient that there may be a patient responsible charge related to this service. The patient expressed understanding and agreed to proceed.    Shared Decision Making Visit Lung Cancer Screening Program [phone])   Eligibility: Age 57 y.o. Pack Years Smoking History Calculation 42 pack year smoking history (# packs/per year x # years smoked) Recent History of coughing up blood  no Unexplained weight loss? no ( >Than 15 pounds within the last 6 months ) Prior History Lung / other cancer no (Diagnosis within the last 5 years already requiring surveillance chest CT Scans). Smoking Status Current Smoker Former Smokers: Years since quit:  NA  Quit Date:  NA  Visit Components: Discussion included one or more decision making aids. yes Discussion included risk/benefits of screening. yes Discussion included potential follow up diagnostic testing for abnormal scans. yes Discussion included meaning and risk of over diagnosis. yes Discussion included meaning and risk of False Positives. yes Discussion included meaning of total radiation exposure. yes  Counseling Included: Importance of adherence to annual lung cancer LDCT screening. yes Impact of comorbidities on ability to participate in the program. yes Ability and willingness to under diagnostic treatment. yes  Smoking Cessation Counseling: Current Smokers:  Discussed importance of smoking cessation. yes Information about tobacco cessation classes and  interventions provided to patient. yes Patient provided with "ticket" for LDCT Scan. yes Symptomatic Patient. no  Counseling NA Diagnosis Code: Tobacco Use Z72.0 Asymptomatic Patient yes  Counseling (Intermediate counseling: > three minutes counseling) [id number] Former Smokers:  Discussed the importance of maintaining cigarette abstinence. yes Diagnosis Code: Personal History of Nicotine Dependence. [id number] Information about tobacco cessation classes and interventions provided to patient. Yes Patient provided with "ticket" for LDCT Scan. yes Written Order for Lung Cancer Screening with LDCT placed in Epic. Yes (CT Chest Lung Cancer Screening Low Dose W/O CM) [id number] Z12.2-Screening of respiratory organs Z87.891-Personal history of nicotine dependence  I have spent 25 minutes of face to face/ virtual visit   time with  Ms. Ruisi discussing the risks and benefits of lung cancer screening. We viewed / discussed a power point together that explained in detail the above noted topics. We paused at intervals to allow for questions to be asked and answered to ensure understanding.We discussed that the single most powerful action that she can take to decrease her risk of developing lung cancer is to quit smoking. We discussed whether or not she is ready to commit to setting a quit date. We discussed options for tools to aid in quitting smoking including nicotine replacement therapy, non-nicotine medications, support groups, Quit Smart classes, and behavior modification. We discussed that often times setting smaller, more achievable goals, such as eliminating 1 cigarette a day for a week and then 2 cigarettes a day for a week can be helpful in slowly decreasing the number of cigarettes smoked. This allows for a sense of accomplishment as well as providing a clinical benefit. I provided  her  with smoking cessation  information  with contact information for community  resources, classes, free nicotine replacement  therapy, and access to mobile apps, text messaging, and on-line smoking cessation help. I have also provided  her  the office contact information in the event she needs to contact me, or the screening staff. We discussed the time and location of the scan, and that either Doroteo Glassman RN, Joella Prince, RN  or I will call / send a letter with the results within 24-72 hours of receiving them. The patient verbalized understanding of all of  the above and had no further questions upon leaving the office. They have my contact information in the event they have any further questions.  I spent 3 minutes counseling on smoking cessation and the health risks of continued tobacco abuse.  I explained to the patient that there has been a high incidence of coronary artery disease noted on these exams. I explained that this is a non-gated exam therefore degree or severity cannot be determined. This patient is not on statin therapy. I have asked the patient to follow-up with their PCP regarding any incidental finding of coronary artery disease and management with diet or medication as their PCP  feels is clinically indicated. The patient verbalized understanding of the above and had no further questions upon completion of the visit.      Magdalen Spatz, NP 02/03/2023

## 2023-02-04 ENCOUNTER — Ambulatory Visit
Admission: RE | Admit: 2023-02-04 | Discharge: 2023-02-04 | Disposition: A | Discharge status: Home / Self Care | Payer: Medicare Other | Source: Ambulatory Visit | Attending: Acute Care | Admitting: Acute Care

## 2023-02-04 DIAGNOSIS — Z87891 Personal history of nicotine dependence: Secondary | ICD-10-CM

## 2023-02-04 DIAGNOSIS — Z122 Encounter for screening for malignant neoplasm of respiratory organs: Secondary | ICD-10-CM | POA: Diagnosis present

## 2023-02-04 DIAGNOSIS — F17210 Nicotine dependence, cigarettes, uncomplicated: Secondary | ICD-10-CM

## 2023-02-05 ENCOUNTER — Other Ambulatory Visit: Payer: Self-pay | Admitting: Acute Care

## 2023-02-05 DIAGNOSIS — Z122 Encounter for screening for malignant neoplasm of respiratory organs: Secondary | ICD-10-CM

## 2023-02-05 DIAGNOSIS — Z87891 Personal history of nicotine dependence: Secondary | ICD-10-CM

## 2023-02-05 DIAGNOSIS — F17210 Nicotine dependence, cigarettes, uncomplicated: Secondary | ICD-10-CM

## 2023-05-23 ENCOUNTER — Emergency Department
Admission: EM | Admit: 2023-05-23 | Discharge: 2023-05-23 | Disposition: A | Discharge status: Home / Self Care | Payer: Medicare Other | Attending: Emergency Medicine | Admitting: Emergency Medicine

## 2023-05-23 ENCOUNTER — Emergency Department: Payer: Medicare Other

## 2023-05-23 ENCOUNTER — Other Ambulatory Visit: Payer: Self-pay

## 2023-05-23 DIAGNOSIS — S61215A Laceration without foreign body of left ring finger without damage to nail, initial encounter: Secondary | ICD-10-CM | POA: Insufficient documentation

## 2023-05-23 DIAGNOSIS — J45909 Unspecified asthma, uncomplicated: Secondary | ICD-10-CM | POA: Insufficient documentation

## 2023-05-23 DIAGNOSIS — W272XXA Contact with scissors, initial encounter: Secondary | ICD-10-CM | POA: Diagnosis not present

## 2023-05-23 DIAGNOSIS — S62631B Displaced fracture of distal phalanx of left index finger, initial encounter for open fracture: Secondary | ICD-10-CM | POA: Diagnosis not present

## 2023-05-23 DIAGNOSIS — Z23 Encounter for immunization: Secondary | ICD-10-CM | POA: Insufficient documentation

## 2023-05-23 HISTORY — DX: Other bursitis of hip, left hip: M70.71

## 2023-05-23 HISTORY — DX: Other intervertebral disc degeneration, lumbar region without mention of lumbar back pain or lower extremity pain: M51.369

## 2023-05-23 MED ORDER — OXYCODONE-ACETAMINOPHEN 5-325 MG PO TABS: 1.0000 | ORAL_TABLET | ORAL | 0 refills | Status: AC | PRN

## 2023-05-23 MED ORDER — CEPHALEXIN 500 MG PO CAPS: 500.0000 mg | ORAL_CAPSULE | Freq: Four times a day (QID) | ORAL | 0 refills | Status: AC

## 2023-05-23 MED ORDER — OXYCODONE-ACETAMINOPHEN 5-325 MG PO TABS
1.0000 | ORAL_TABLET | Freq: Once | ORAL | Status: AC
  Administered 2023-05-23: 1 via ORAL
  Filled 2023-05-23: qty 1

## 2023-05-23 MED ORDER — LIDOCAINE HCL (PF) 1 % IJ SOLN
5.0000 mL | Freq: Once | INTRAMUSCULAR | Status: AC
  Administered 2023-05-23: 5 mL via INTRADERMAL
  Filled 2023-05-23: qty 5

## 2023-05-23 MED ORDER — CEPHALEXIN 500 MG PO CAPS
500.0000 mg | ORAL_CAPSULE | Freq: Once | ORAL | Status: AC
  Administered 2023-05-23: 500 mg via ORAL
  Filled 2023-05-23: qty 1

## 2023-05-23 MED ORDER — ONDANSETRON 4 MG PO TBDP
4.0000 mg | ORAL_TABLET | Freq: Once | ORAL | Status: AC
  Administered 2023-05-23: 4 mg via ORAL
  Filled 2023-05-23: qty 1

## 2023-05-23 MED ORDER — TETANUS-DIPHTH-ACELL PERTUSSIS 5-2.5-18.5 LF-MCG/0.5 IM SUSY
0.5000 mL | PREFILLED_SYRINGE | Freq: Once | INTRAMUSCULAR | Status: AC
  Administered 2023-05-23: 0.5 mL via INTRAMUSCULAR
  Filled 2023-05-23: qty 0.5

## 2023-05-23 NOTE — ED Provider Notes (Signed)
[facility] Provider Note    Event Date/Time   First MD Initiated Contact with Patient 05/23/23 1353     (approximate)   History   Finger Injury   HPI  Julia Hoover is a 57 y.o. female with history of fibromyalgia, DJD, hepatitis, osteoporosis and asthma presents emergency department after a left ring finger injury.  Patient was using an electric shears and then cut her finger.  Patient then began to vomit and almost passed out due to the blood.  States she really is unsure of how long ago her last Tdap was      Physical Exam   Triage Vital Signs: ED Triage Vitals  Encounter Vitals Group     BP 05/23/23 1346 108/70     Systolic BP Percentile --      Diastolic BP Percentile --      Pulse Rate 05/23/23 1346 75     Resp 05/23/23 1346 16     Temp 05/23/23 1346 97.8 F (36.6 C)     Temp Source 05/23/23 1346 Oral     SpO2 05/23/23 1346 98 %     Weight 05/23/23 1343 135 lb (61.2 kg)     Height 05/23/23 1343 5\' 2"  (1.575 m)     Head Circumference --      Peak Flow --      Pain Score 05/23/23 1342 10     Pain Loc --      Pain Education --      Exclude from Growth Chart --     Most recent vital signs: Vitals:   05/23/23 1346  BP: 108/70  Pulse: 75  Resp: 16  Temp: 97.8 F (36.6 C)  SpO2: 98%     General: Awake, no distress.   CV:  Good peripheral perfusion. regular rate and  rhythm Resp:  Normal effort.  Abd:  No distention.   Other:  Left ring finger with laceration that is three quarters of the way circumferential, no foreign body noted, neurovascular is intact   ED Results / Procedures / Treatments   Labs (all labs ordered are listed, but only abnormal results are displayed) Labs Reviewed - No data to display   EKG     RADIOLOGY X-ray of the left ring finger    PROCEDURES:   .Marland KitchenLaceration Repair  Date/Time: 05/23/2023 3:18 PM  Performed by: Faythe Ghee, PA-C Authorized by: Faythe Ghee, PA-C    Consent:    Consent obtained:  Verbal   Consent given by:  Patient   Risks, benefits, and alternatives were discussed: yes     Risks discussed:  Infection, pain, retained foreign body, tendon damage, poor cosmetic result, need for additional repair, nerve damage, poor wound healing and vascular damage   Alternatives discussed:  Referral Universal protocol:    Procedure explained and questions answered to patient or proxy's satisfaction: yes     Immediately prior to procedure, a time out was called: yes     Patient identity confirmed:  Verbally with patient Anesthesia:    Anesthesia method:  Local infiltration   Local anesthetic:  Lidocaine 1% w/o epi Laceration details:    Location:  Finger   Finger location:  L ring finger   Length (cm):  3 Pre-procedure details:    Preparation:  Patient was prepped and draped in usual sterile fashion and imaging obtained to evaluate for foreign bodies Exploration:    Hemostasis achieved with:  Direct pressure   Imaging  obtained: x-ray     Imaging outcome: foreign body not noted     Wound exploration: wound explored through full range of motion and entire depth of wound visualized     Wound extent: underlying fracture     Wound extent: areolar tissue not violated, fascia not violated, no foreign body, no signs of injury, no nerve damage, no tendon damage and no vascular damage     Contaminated: no   Treatment:    Area cleansed with:  Povidone-iodine and saline   Amount of cleaning:  Extensive   Irrigation solution:  Sterile saline   Irrigation method:  Syringe and tap Skin repair:    Repair method:  Sutures   Suture size:  4-0 and 5-0   Suture material:  Nylon   Suture technique:  Simple interrupted   Number of sutures:  9 Approximation:    Approximation:  Close Repair type:    Repair type:  Simple Post-procedure details:    Dressing:  Non-adherent dressing and splint for protection   Procedure completion:  Tolerated well, no immediate  complications    MEDICATIONS ORDERED IN ED: Medications  Tdap (BOOSTRIX) injection 0.5 mL (has no administration in time range)  cephALEXin (KEFLEX) capsule 500 mg (has no administration in time range)  oxyCODONE-acetaminophen (PERCOCET/ROXICET) 5-325 MG per tablet 1 tablet (has no administration in time range)  lidocaine (PF) (XYLOCAINE) 1 % injection 5 mL (5 mLs Intradermal Given 05/23/23 1408)  ondansetron (ZOFRAN-ODT) disintegrating tablet 4 mg (4 mg Oral Given 05/23/23 1407)     IMPRESSION / MDM / ASSESSMENT AND PLAN / ED COURSE  I reviewed the triage vital signs and the nursing notes.                              Differential diagnosis includes, but is not limited to, laceration, fracture, avulsion  Patient's presentation is most consistent with acute presentation with potential threat to life or bodily function.   X-ray of the left ring finger shows a fracture through the distal portion, laceration overlying the fracture   See procedure note for laceration repair  The patient tolerated procedure well.  She was given a dose of Keflex and Percocet while here in the ED.  She is to follow-up [facility] clinic orthopedics.  Sent secure message to Dr. Allena Katz notifying him that should be calling for an appointment.  She is to return emergency department if worsening.  She is given a prescription for Keflex Percocet.  She was discharged in stable condition.  Tdap was updated as she was unsure of the exact date of her last tetanus.   FINAL CLINICAL IMPRESSION(S) / ED DIAGNOSES   Final diagnoses:  Open displaced fracture of distal phalanx of left index finger, initial encounter  Laceration of left ring finger without foreign body, nail damage status unspecified, initial encounter     Rx / DC Orders   ED Discharge Orders          Ordered    cephALEXin (KEFLEX) 500 MG capsule  4 times daily        05/23/23 1516    oxyCODONE-acetaminophen (PERCOCET) 5-325 MG tablet  Every 4 hours  PRN        05/23/23 1516             Note:  This document was prepared using Dragon voice recognition software and may include unintentional dictation errors.    Faythe Ghee, PA-C  05/23/23 1521    Chesley Noon, MD 05/23/23 1525

## 2023-05-23 NOTE — ED Notes (Signed)
Patient states she wants the Percocet in her for when the lidocaine wears off.

## 2023-05-23 NOTE — Discharge Instructions (Signed)
Follow-up with [facility] clinic orthopedics.  We would like for you to be seen on Monday or Tuesday if they can fit you in.  Please tell them you are seen in the emergency department.  Take your medication as prescribed.  If you are able to see one of the doctors at [facility] on Monday or Tuesday just leave the dressing intact until you see them.  Try not to get it wet.  If the dressing becomes soaked and you may change it but reapply the finger splint.  Return emergency department if any problems.

## 2023-05-23 NOTE — ED Triage Notes (Signed)
Pt to ED POV for severe L ring finger injury from electric grass shears about 30 minutes ago. Injury is extremely dep, fingernail is cut through, with tip of finger attached only with small piece of tissue. Not bleeding. Pt states she lost consciousness after the injury, EKG done in triage. Last Tdap 5-10 years ago.

## 2023-05-23 NOTE — ED Notes (Signed)
Patient declined discharge vital signs. Patient 4th finger on left hand was cleaned below the wound, wrapped with a vaseline dressing, wrapped with a sterile gauze, wrapped with kling and then a finger splint was placed. Patient tolerated procedure well.

## 2023-09-24 ENCOUNTER — Other Ambulatory Visit: Payer: Self-pay | Admitting: Orthopedic Surgery

## 2023-09-24 DIAGNOSIS — M4807 Spinal stenosis, lumbosacral region: Secondary | ICD-10-CM

## 2023-09-24 DIAGNOSIS — G8929 Other chronic pain: Secondary | ICD-10-CM

## 2023-09-26 ENCOUNTER — Ambulatory Visit
Admission: RE | Admit: 2023-09-26 | Discharge: 2023-09-26 | Disposition: A | Discharge status: Home / Self Care | Payer: Medicare Other | Source: Ambulatory Visit | Attending: Orthopedic Surgery | Admitting: Orthopedic Surgery

## 2023-09-26 DIAGNOSIS — G8929 Other chronic pain: Secondary | ICD-10-CM

## 2023-09-26 DIAGNOSIS — M4807 Spinal stenosis, lumbosacral region: Secondary | ICD-10-CM

## 2023-10-07 ENCOUNTER — Other Ambulatory Visit: Payer: Self-pay | Admitting: Family Medicine

## 2023-10-07 DIAGNOSIS — R7989 Other specified abnormal findings of blood chemistry: Secondary | ICD-10-CM

## 2023-10-12 ENCOUNTER — Ambulatory Visit: Payer: Medicare Other

## 2023-10-15 ENCOUNTER — Ambulatory Visit
Admission: RE | Admit: 2023-10-15 | Discharge: 2023-10-15 | Disposition: A | Discharge status: Home / Self Care | Payer: Medicare Other | Source: Ambulatory Visit | Attending: Family Medicine | Admitting: Family Medicine

## 2023-10-15 DIAGNOSIS — R7989 Other specified abnormal findings of blood chemistry: Secondary | ICD-10-CM | POA: Insufficient documentation

## 2023-11-17 ENCOUNTER — Other Ambulatory Visit: Payer: Self-pay | Admitting: Family Medicine

## 2023-11-17 DIAGNOSIS — R159 Full incontinence of feces: Secondary | ICD-10-CM

## 2023-11-25 ENCOUNTER — Other Ambulatory Visit: Payer: Medicare Other

## 2023-11-27 ENCOUNTER — Ambulatory Visit
Admission: RE | Admit: 2023-11-27 | Discharge: 2023-11-27 | Disposition: A | Discharge status: Home / Self Care | Payer: Medicare Other | Source: Ambulatory Visit | Attending: Family Medicine | Admitting: Family Medicine

## 2023-11-27 DIAGNOSIS — R159 Full incontinence of feces: Secondary | ICD-10-CM

## 2023-12-15 ENCOUNTER — Ambulatory Visit: Admission: RE | Admit: 2023-12-15 | Payer: Medicare Other | Source: Home / Self Care

## 2023-12-15 ENCOUNTER — Encounter: Admission: RE | Payer: Self-pay | Source: Home / Self Care

## 2023-12-15 SURGERY — COLONOSCOPY WITH PROPOFOL
Anesthesia: General

## 2024-01-06 ENCOUNTER — Other Ambulatory Visit: Payer: Self-pay | Admitting: Family Medicine

## 2024-01-06 ENCOUNTER — Other Ambulatory Visit: Payer: Self-pay

## 2024-01-06 DIAGNOSIS — Z1231 Encounter for screening mammogram for malignant neoplasm of breast: Secondary | ICD-10-CM

## 2024-01-06 DIAGNOSIS — Z87891 Personal history of nicotine dependence: Secondary | ICD-10-CM

## 2024-01-06 DIAGNOSIS — F17210 Nicotine dependence, cigarettes, uncomplicated: Secondary | ICD-10-CM

## 2024-01-06 DIAGNOSIS — Z122 Encounter for screening for malignant neoplasm of respiratory organs: Secondary | ICD-10-CM

## 2024-01-14 ENCOUNTER — Ambulatory Visit
Admission: RE | Admit: 2024-01-14 | Discharge: 2024-01-14 | Disposition: A | Discharge status: Home / Self Care | Payer: Medicare Other | Source: Ambulatory Visit | Attending: Family Medicine | Admitting: Family Medicine

## 2024-01-14 DIAGNOSIS — Z1231 Encounter for screening mammogram for malignant neoplasm of breast: Secondary | ICD-10-CM | POA: Insufficient documentation

## 2024-02-10 ENCOUNTER — Ambulatory Visit
Admission: RE | Admit: 2024-02-10 | Discharge: 2024-02-10 | Disposition: A | Discharge status: Home / Self Care | Payer: Medicare Other | Source: Ambulatory Visit | Attending: *Deleted | Admitting: *Deleted

## 2024-02-10 DIAGNOSIS — I2510 Atherosclerotic heart disease of native coronary artery without angina pectoris: Secondary | ICD-10-CM | POA: Diagnosis not present

## 2024-02-10 DIAGNOSIS — F17210 Nicotine dependence, cigarettes, uncomplicated: Secondary | ICD-10-CM | POA: Insufficient documentation

## 2024-02-10 DIAGNOSIS — Z87891 Personal history of nicotine dependence: Secondary | ICD-10-CM

## 2024-02-10 DIAGNOSIS — Z122 Encounter for screening for malignant neoplasm of respiratory organs: Secondary | ICD-10-CM | POA: Insufficient documentation

## 2024-02-10 DIAGNOSIS — J439 Emphysema, unspecified: Secondary | ICD-10-CM | POA: Diagnosis not present

## 2024-03-11 ENCOUNTER — Other Ambulatory Visit: Payer: Self-pay | Admitting: Acute Care

## 2024-03-11 DIAGNOSIS — Z87891 Personal history of nicotine dependence: Secondary | ICD-10-CM

## 2024-03-11 DIAGNOSIS — Z122 Encounter for screening for malignant neoplasm of respiratory organs: Secondary | ICD-10-CM

## 2024-03-29 ENCOUNTER — Ambulatory Visit: Payer: Self-pay

## 2024-03-30 NOTE — Telephone Encounter (Signed)
 Lung cancer screening scan

## 2024-07-13 ENCOUNTER — Encounter: Payer: Self-pay | Admitting: *Deleted

## 2024-07-19 ENCOUNTER — Ambulatory Visit
Admission: RE | Admit: 2024-07-19 | Discharge: 2024-07-19 | Disposition: A | Discharge status: Home / Self Care | Attending: Physician Assistant | Admitting: Physician Assistant

## 2024-07-19 ENCOUNTER — Encounter: Payer: Self-pay | Admitting: Physician Assistant

## 2024-07-19 ENCOUNTER — Ambulatory Visit
Admission: RE | Admit: 2024-07-19 | Discharge: 2024-07-19 | Disposition: A | Discharge status: Home / Self Care | Source: Ambulatory Visit | Attending: Physician Assistant | Admitting: Physician Assistant

## 2024-07-19 ENCOUNTER — Ambulatory Visit: Admitting: Physician Assistant

## 2024-07-19 VITALS — BP 94/62 | Ht 62.0 in | Wt 145.0 lb

## 2024-07-19 DIAGNOSIS — M4722 Other spondylosis with radiculopathy, cervical region: Secondary | ICD-10-CM

## 2024-07-19 DIAGNOSIS — M4802 Spinal stenosis, cervical region: Secondary | ICD-10-CM

## 2024-07-19 DIAGNOSIS — M5030 Other cervical disc degeneration, unspecified cervical region: Secondary | ICD-10-CM

## 2024-07-19 NOTE — Progress Notes (Signed)
 Referring Physician:  Harvey Gaetana CROME, NP [address] [place],  [place] [address]  Primary Physician:  Harvey Gaetana CROME, NP  History of Present Illness: 07/19/2024 Ms. Capria Cartaya is here today with a chief complaint of chronic neck pain x 3 years that has become progressively worse recently.  She is like her neck pain radiates up the back of her head and causes headaches.  At times she feels as though it radiates into her right arm, however also states that it does not come from her neck and largely comes from her elbow down.  Numbness and tingling involves all 5 fingers in her right upper extremity.  She states that it is the worst at night or when she is on the phone.  It often wakes her up from sleep and she has to shake out her arm in order to decrease the numbness she is experiencing.  She denies any weakness.  Denies any changes to grip or fine motor skills.  Currently sees pain management to help with her pain which she feels as though has been very helpful.     Weakness: none Bowel/Bladder Dysfunction: none  Conservative measures:  Physical therapy: no Multimodal medical therapy including regular antiinflammatories: oxycodone , gabapentin, celebrex    Past Surgery:  Right rotator cuff repair.  The symptoms are causing a significant impact on the patient's life.   Review of Systems:  A 10 point review of systems is negative, except for the pertinent positives and negatives detailed in the HPI.  Past Medical History: Past Medical History:  Diagnosis Date   Alcohol abuse    Anemia    Anxiety    Arthralgia of multiple joints    Arthritis    neck (noi limitations), fingers   Asthma    Bulging lumbar disc    Bursitis of both hips    Constipation    Degenerative joint disease (DJD) of lumbar spine    Depression    Dysmenorrhea    Dysrhythmia    had irreg beat approx 12 years ago after taking ecstasy.  none since.   Fibroids    Fibromyalgia    Greater  trochanteric bursitis of both hips    Hepatitis    Hep B - as a child   Menorrhagia    Numbness in both hands    upon awakening, then goes away   Osteoporosis    PTSD (post-traumatic stress disorder)    Rotator cuff tear, right 06/2022   Scoliosis     Past Surgical History: Past Surgical History:  Procedure Laterality Date   BREAST BIOPSY Right    neg   BREAST CYST ASPIRATION Right 2013   neg   CHOLECYSTECTOMY     KNEE SURGERY Right    thickening of bands   NOSE SURGERY     fracture as a child   SHOULDER ARTHROSCOPY WITH ROTATOR CUFF REPAIR AND SUBACROMIAL DECOMPRESSION Right 06/20/2022   Procedure: Right shoulder arthroscopic vs mini-open rotator cuff repair, biceps tenodesis, distal clavicle excision, subacromial decompression;  Surgeon: Tobie Priest, MD;  Location: [facility] ORS;  Service: Orthopedics;  Laterality: Right;   TONSILLECTOMY     TUBAL LIGATION     ULNAR COLLATERAL LIGAMENT RECONSTRUCTION Left 2009    Allergies: Allergies as of 07/19/2024 - Review Complete 07/19/2024  Allergen Reaction Noted   Methocarbamol Diarrhea 07/13/2024   Tizanidine Diarrhea, Other (See Comments), and Nausea Only 06/15/2023   Tizanidine hcl Diarrhea 07/13/2024   Latex Rash 06/14/2015  Medications: Outpatient Encounter Medications as of 07/19/2024  Medication Sig   albuterol  (PROVENTIL  HFA;VENTOLIN  HFA) 108 (90 Base) MCG/ACT inhaler Inhale 1-2 puffs into the lungs every 6 (six) hours as needed for wheezing or shortness of breath.   amphetamine-dextroamphetamine (ADDERALL XR) 10 MG 24 hr capsule Take 10 mg by mouth daily.   Calcium Carbonate (CALCIUM 500 PO) Take 2 tablets by mouth daily.   carisoprodol (SOMA) 250 MG tablet Take 250 mg by mouth 4 (four) times daily as needed.   celecoxib (CELEBREX) 100 MG capsule Take 100 mg by mouth 2 (two) times daily.   Cholecalciferol (VITAMIN D-3) 25 MCG (1000 UT) CAPS Take 1 capsule by mouth in the morning and at bedtime.   clonazePAM  (KLONOPIN )  0.5 MG tablet Take 0.5 mg by mouth 3 (three) times daily as needed for anxiety.   cyanocobalamin (VITAMIN B12) 1000 MCG/ML injection Inject 1,000 mcg into the muscle.   escitalopram (LEXAPRO) 20 MG tablet Take 20 mg by mouth daily.   folic acid (FOLVITE) 1 MG tablet Take 1 mg by mouth daily.   gabapentin (NEURONTIN) 300 MG capsule Take 600 mg by mouth 3 (three) times daily.   linaclotide (LINZESS) 145 MCG CAPS capsule Take 145 mcg by mouth.   methotrexate (RHEUMATREX) 2.5 MG tablet Take 15 mg by mouth.   Na Sulfate-K Sulfate-Mg Sulfate concentrate (SUPREP) 17.5-3.13-1.6 GM/177ML SOLN Take 1 Bottle by mouth.   oxyCODONE  (OXY IR/ROXICODONE ) 5 MG immediate release tablet SMARTSIG:1 Tablet(s) By Mouth 6 Times Daily PRN   pantoprazole (PROTONIX) 40 MG tablet Take 40 mg by mouth daily.   REXULTI 2 MG TABS tablet Take 2 mg by mouth daily.   Romosozumab-aqqg (EVENITY) 105 MG/1.17ML SOSY injection Inject 210 mg into the skin.   rosuvastatin (CRESTOR) 10 MG tablet Take 10 mg by mouth daily.   traZODone (DESYREL) 150 MG tablet Take 150 mg by mouth at bedtime.   TRELEGY ELLIPTA 100-62.5-25 MCG/ACT AEPB Inhale 1 puff into the lungs daily.   zoledronic acid (RECLAST) 5 MG/100ML SOLN injection Inject 5 mg into the vein once. annually (Patient not taking: Reported on 07/19/2024)   [DISCONTINUED] tiZANidine (ZANAFLEX) 4 MG tablet Take by mouth 3 (three) times daily.   No facility-administered encounter medications on file as of 07/19/2024.    Social History: Social History   Tobacco Use   Smoking status: Every Day    Current packs/day: 1.00    Average packs/day: 1 pack/day for 32.0 years (32.0 ttl pk-yrs)    Types: Cigarettes    Passive exposure: Current   Smokeless tobacco: Never  Vaping Use   Vaping status: Former  Substance Use Topics   Alcohol use: Yes    Alcohol/week: 3.0 standard drinks of alcohol    Types: 3 Glasses of wine per week   Drug use: No    Family Medical History: Family History   Problem Relation Age of Onset   Breast cancer Mother 80   Hypertension Mother    Hyperlipidemia Mother    Breast cancer Cousin 42       pat cousin    Physical Examination: @VITALWITHPAIN @  General: Patient is well developed, well nourished, calm, collected, and in no apparent distress. Attention to examination is appropriate.  Psychiatric: Patient is non-anxious.  Head:  Pupils equal, round, and reactive to light.  ENT:  Oral mucosa appears well hydrated.  Neck:   Supple.  Decreased range of motion.  Respiratory: Patient is breathing without any difficulty.  Extremities: No edema.  Vascular: Palpable dorsal pedal pulses.  Skin:   On exposed skin, there are no abnormal skin lesions.  NEUROLOGICAL:     Awake, alert, oriented to person, place, and time.  Speech is clear and fluent. Fund of knowledge is appropriate.   Cranial Nerves: Pupils equal round and reactive to light.  Facial tone is symmetric.   ROM of spine: Decreased cervical spine range of motion.  Tenderness palpation of cervical paraspinals.  Positive carpal compression test, positive Tinel of right carpal tunnel, positive Phalen's.  Positive Tinel of right ulnar nerve.  Strength:  Slight decrease in right shoulder likely due to previous right shoulder surgery. Side Biceps Triceps Deltoid Interossei Grip Wrist Ext. Wrist Flex.  R 5 5 4+.. 5 5 5 5   L 5 5 5 5 5 5 5     Reflexes are 2+ and symmetric at the biceps, triceps, brachioradialis.  Hoffman's is absent.  Clonus is not present.  Toes are down-going.  Bilateral upper and lower extremity sensation is intact to light touch.    Gait is normal.   No difficulty with tandem gait.   No evidence of dysmetria noted.  Medical Decision Making  Imaging: EXAM: X-ray cervical spine 2 to 3 views   INDICATION:   Cervical radicular pain [M54.12 (ICD-10-CM)]  COMPARISON: 2 view cervical spine 04/25/2022   TECHNIQUE AND FINDINGS:  2 view cervical spine    Vertebral body heights and cervical lordosis maintained.  Multilevel disc  space narrowing appreciated most prominent at the C5-6 level with moderate  to space narrowing endplate sclerosis and peripheral hypertrophic  spurring.  Prevertebral soft tissues unremarkable.  No evidence of acute  fracture or dislocation.    CLINICAL DATA:  Neck and right arm pain for 6 months.   EXAM: MRI CERVICAL SPINE WITHOUT CONTRAST   TECHNIQUE: Multiplanar, multisequence MR imaging of the cervical spine was performed. No intravenous contrast was administered.   COMPARISON:  None Available.   FINDINGS: Alignment: Normal   Vertebrae: Normal marrow signal. No bone lesions or fractures.   Cord: Normal cord signal intensity. No cord lesions or syrinx.   Posterior Fossa, vertebral arteries, paraspinal tissues: No significant findings.   Disc levels:   C2-3: No significant findings.   C3-4: No significant findings.   C4-5: Shallow left-sided disc osteophyte complex with mild lateral recess foraminal encroachment but no significant stenosis.   C5-6: Degenerative disc disease with bilateral disc osteophyte complexes, right greater than left. Moderate right and mild left foraminal stenosis and mild bilateral lateral recess stenosis. No significant spinal stenosis.   C6-7: Shallow left-sided disc osteophyte complex with mild left foraminal encroachment.   C7-T1: No significant findings.   IMPRESSION: 1. Shallow left-sided disc osteophyte complex at C4-5 with mild lateral recess foraminal encroachment. 2. Degenerative disc disease at C5-6 with bilateral disc osteophyte complexes, right greater than left. Moderate right and mild left foraminal stenosis and mild bilateral lateral recess stenosis. 3. Shallow left-sided disc osteophyte complex at C6-7 with mild left foraminal encroachment.      I have personally reviewed the images and agree with the above interpretation.  Assessment  and Plan: Ms. Tkach is a pleasant 58 y.o. female with worsening neck pain in the setting of known degenerative disc disease and right foraminal stenosis previous seen on MRI.  Pain extends up to her head she is also been experiencing new numbness and tingling in all 5 digits of her right hand.  Numbness and tingling is worse at night when she  is holding her phone.  She often has to shake her hand out to get rid of the numbness and tingling.  She feels as though her range of motion has decreased.  On examination, some concern for right carpal tunnel syndrome as well as a right ulnar neuropathy.  Plan moving forward includes the following:  -Cervical x-rays including flexion-extension to evaluate for listhesis - MRI of cervical spine. - Referral to physical therapy for her neck pain. - EMG of right upper extremity recommended for evaluation of right cervical radiculopathy versus a peripheral neuropathy (right carpal tunnel syndrome, ulnar neuropathy).  In the meantime I have advised patient to wear a brace on her wrist and her elbow at night to try to reduce nighttime wakings and help with her pain. - Will review results once complete.  See back in 8 weeks.     Thank you for involving me in the care of this patient.      Lyle Decamp, PA-C Dept. of Neurosurgery

## 2024-07-25 HISTORY — DX: Trochanteric bursitis, right hip: M70.61

## 2024-07-25 HISTORY — DX: Post-traumatic stress disorder, unspecified: F43.10

## 2024-07-25 HISTORY — DX: Pain in unspecified joint: M25.50

## 2024-07-25 HISTORY — DX: Alcohol abuse, uncomplicated: F10.10

## 2024-08-03 ENCOUNTER — Ambulatory Visit
Admission: RE | Admit: 2024-08-03 | Discharge: 2024-08-03 | Disposition: A | Discharge status: Home / Self Care | Source: Ambulatory Visit | Attending: Physician Assistant | Admitting: Physician Assistant

## 2024-08-03 DIAGNOSIS — M4722 Other spondylosis with radiculopathy, cervical region: Secondary | ICD-10-CM | POA: Insufficient documentation

## 2024-08-05 ENCOUNTER — Other Ambulatory Visit: Payer: Self-pay | Admitting: Family Medicine

## 2024-08-05 DIAGNOSIS — S39002A Unspecified injury of muscle, fascia and tendon of lower back, initial encounter: Secondary | ICD-10-CM

## 2024-08-05 DIAGNOSIS — R222 Localized swelling, mass and lump, trunk: Secondary | ICD-10-CM

## 2024-08-08 ENCOUNTER — Ambulatory Visit: Payer: Self-pay | Admitting: Physician Assistant

## 2024-08-11 ENCOUNTER — Ambulatory Visit
Admission: RE | Admit: 2024-08-11 | Discharge: 2024-08-11 | Disposition: A | Discharge status: Home / Self Care | Source: Ambulatory Visit | Attending: Family Medicine | Admitting: Family Medicine

## 2024-08-11 DIAGNOSIS — R222 Localized swelling, mass and lump, trunk: Secondary | ICD-10-CM | POA: Diagnosis present

## 2024-08-11 DIAGNOSIS — S39002A Unspecified injury of muscle, fascia and tendon of lower back, initial encounter: Secondary | ICD-10-CM | POA: Insufficient documentation

## 2024-08-29 ENCOUNTER — Ambulatory Visit: Admitting: Physical Therapy

## 2024-08-29 NOTE — Therapy (Deleted)
 OUTPATIENT PHYSICAL THERAPY NECK EVALUATION   Patient Name: Julia Hoover MRN: [id number] DOB:01/12/1966, 58 y.o., female Today's Date: 08/29/2024  END OF SESSION:   Past Medical History:  Diagnosis Date   Alcohol abuse    Anemia    Anxiety    Arthralgia of multiple joints    Arthritis    neck (noi limitations), fingers   Asthma    Bulging lumbar disc    Bursitis of both hips    Constipation    Degenerative joint disease (DJD) of lumbar spine    Depression    Dysmenorrhea    Dysrhythmia    had irreg beat approx 12 years ago after taking ecstasy.  none since.   Fibroids    Fibromyalgia    Greater trochanteric bursitis of both hips    Hepatitis    Hep B - as a child   Menorrhagia    Numbness in both hands    upon awakening, then goes away   Osteoporosis    PTSD (post-traumatic stress disorder)    Rotator cuff tear, right 06/2022   Scoliosis    Past Surgical History:  Procedure Laterality Date   BREAST BIOPSY Right    neg   BREAST CYST ASPIRATION Right 2013   neg   CHOLECYSTECTOMY     KNEE SURGERY Right    thickening of bands   NOSE SURGERY     fracture as a child   SHOULDER ARTHROSCOPY WITH ROTATOR CUFF REPAIR AND SUBACROMIAL DECOMPRESSION Right 06/20/2022   Procedure: Right shoulder arthroscopic vs mini-open rotator cuff repair, biceps tenodesis, distal clavicle excision, subacromial decompression;  Surgeon: Tobie Priest, MD;  Location: [facility] OR[facility];  Service: Orthopedics;  Laterality: Right;   TONSILLECTOMY     TUBAL LIGATION     ULNAR COLLATERAL LIGAMENT RECONSTRUCTION Left 2009   There are no active problems to display for this patient.   PCP: Harvey Gaetana CROME, NP  REFERRING PROVIDER: Ulis Bottcher, PA-C  REFERRING DIAG: ***  RATIONALE FOR EVALUATION AND TREATMENT: {HABREHAB:27488}  THERAPY DIAG: Cervicalgia  Muscle weakness (generalized)  ONSET DATE: ***  FOLLOW-UP APPT SCHEDULED WITH REFERRING PROVIDER: {yes/no:20286}     SUBJECTIVE:                                                                                                                                                                                         Chief Complaint: ***  Pertinent History ***  Pain:  Pain Intensity: Present: /10, Best: /10, Worst: /10 Pain location: *** Pain Quality: {PAIN DESCRIPTION:21022940}  Radiating: {yes/no:20286}  Numbness/Tingling: {yes/no:20286} Focal Weakness: {yes/no:20286} Aggravating factors: *** Relieving factors: *** 24-hour pain behavior: ***  History of prior neck injury, pain, surgery, or therapy: {yes/no:20286} Dominant hand: {RIGHT/LEFT:20294} Imaging: {yes/no:20286}  Red flags (personal history of cancer, h/o spinal tumors, history of compression fracture, chills/fever, night sweats, nausea, vomiting, unrelenting pain): Negative  PRECAUTIONS: {Therapy precautions:24002}  WEIGHT BEARING RESTRICTIONS: {Yes ***/No:24003}  FALLS: Has patient fallen in last 6 months? {fallsyesno:27318}  Living Environment Lives with: {[facility] lives with:25569::lives with their family} Lives in: {Lives in:25570} Stairs: {opstairs:27293} Has following equipment at home: {Assistive devices:23999}  Prior level of function: {PLOF:24004}  Occupational demands:   Hobbies:   Patient Goals: ***   OBJECTIVE:   Patient Surveys  {rehab surveys:24030}  Cognition Patient is oriented to person, place, and time.  Recent memory is intact.  Remote memory is intact.  Attention span and concentration are intact.  Expressive speech is intact.  Patient's fund of knowledge is within normal limits for educational level.    Gross Musculoskeletal Assessment Tremor: None Bulk: Normal Tone: Normal   Gait   Posture   AROM AROM (Normal range in degrees) AROM  Cervical  Flexion (50)   Extension (80)   Right lateral flexion (45)   Left lateral flexion (45)   Right rotation (85)   Left rotation (85)   (*  = pain; Blank rows = not tested)  PROM PROM (Normal range in degrees) PROM  Cervical  Flexion (50)   Extension (80)   Right lateral flexion (45)   Left lateral flexion (45)   Right rotation (85)   Left rotation (85)   (* = pain; Blank rows = not tested)  MMT MMT (out of 5) Right Left  Cervical (isometric)  Flexion WNL  Extension WNL  Lateral Flexion WNL WNL  Rotation WNL WNL      Shoulder   Flexion    Extension    Abduction    Internal rotation    Horizontal abduction    Horizontal adduction    Lower Trapezius    Rhomboids        Elbow  Flexion    Extension    Pronation    Supination        Wrist  Flexion    Extension    Radial deviation    Ulnar deviation        MCP  Flexion    Extension    Abduction    Adduction    (* = pain; Blank rows = not tested)  Sensation Grossly intact to light touch bilateral UE as determined by testing dermatomes C2-T2. Proprioception and hot/cold testing deferred on this date.  Reflexes R/L Elbow: 2+/2+  Brachioradialis: 2+/2+  Tricep: 2+/2+  Palpation Location LEFT  RIGHT           Suboccipitals    Cervical paraspinals    Upper Trapezius    Levator Scapulae    Rhomboid Major/Minor    (Blank rows = not tested) Graded on 0-4 scale (0 = no pain, 1 = pain, 2 = pain with wincing/grimacing/flinching, 3 = pain with withdrawal, 4 = unwilling to allow palpation), (Blank rows = not tested)  Repeated Movements No centralization or peripheralization of symptoms with repeated cervical protraction and retraction.   Passive Accessory Intervertebral Motion Pt denies reproduction of neck pain with CPA C2-T3 and UPA bilaterally C2-T3. Generally, hypomobile throughout   SPECIAL TESTS Spurlings A (ipsilateral lateral flexion/axial compression): R: {NEGATIVE/POSITIVE QNM:80001} L: {NEGATIVE/POSITIVE QNM:80001} Spurlings B (ipsilateral lateral flexion/contralateral rotation/axial compression): R: {NEGATIVE/POSITIVE QNM:80001} L:  {NEGATIVE/POSITIVE QNM:80001} Distraction Test: {NEGATIVE/POSITIVE QNM:80001}  Rosan  Sign (cervical cord compression): R: {NEGATIVE/POSITIVE QNM:80001} L: {NEGATIVE/POSITIVE QNM:80001} ULTT Median: R: {NEGATIVE/POSITIVE QNM:80001} L: {NEGATIVE/POSITIVE QNM:80001} ULTT Ulnar: R: {NEGATIVE/POSITIVE QNM:80001} L: {NEGATIVE/POSITIVE QNM:80001} ULTT Radial: R: {NEGATIVE/POSITIVE QNM:80001} L: {NEGATIVE/POSITIVE QNM:80001}  Clinical Prediction Rules  Diagnostic Cervical Radiculopathy ULTTa: Any one of the following: A) symptom reproduction; B) side-to-side difference >10 degrees in elbow extension; or C) with regard to involved/painful side: ipsilateral neck lateral flexion decreases symptoms and/or contralateral neck lateral flexion increases symptoms.  ROM: involved-side cervical rotation range of motion less than 60 degrees Distraction test: symptom reduction.  Spurling's A: symptom reproduction.   Number of Positive Criteria  Sensitivity  Specificity  Pos LR  Neg LR   Two  0.39 0.56 0.88 1.09   Three  0.39  0.94  6.1 0.65   Four  0.24 0.99  30.3 0.77   Pos LR = positive likelihood ratio. Neg LR = negative likelihood ratio.    Interventional  Cervical Manipulation 1. Symptom duration of less than 38 days 2. Positive expectation that manipulation will help 3. Side-to-side difference in cervical rotation ROM of 10 or greater 4. Pain with posteroanterior spring testing of the middle cervical spine If at least 3 attributes were present (+LR, 13.5), the probability of experiencing a successful outcome is 90%. IMPLICATIONS: The findi  Cervical Traction patient reported periperalization with lower cervical spine (C4-7) mobility testing,  positive shoulder abduction test,  age > 55,  positive upper limb tension test A,  positive neck distraction test  Number of Positive Criteria  Sensitivity  Specificity  Pos LR  Neg LR  Probability of Success w/ traction + exercise  One 0.07 0.97  1.15 0.21 47.6%  Two  0.3 0.97 1.44 0.40 53.2%  Three 0.63 0.87 4.81 0.42 79.2%  Four  0.3 1.0 23.1 0.71 94.8%  Pos LR = positive likelihood ratio. Neg LR = negative likelihood ratio.  Having at least 3 out of 5 predictors appears to be the optimal threshold for choosing the cervical traction as an intervention.    TODAY'S TREATMENT  ***   PATIENT EDUCATION:  Education details: *** Person educated: {Person educated:25204} Education method: {Education Method:25205} Education comprehension: {Education Comprehension:25206}   HOME EXERCISE PROGRAM:    ASSESSMENT:  CLINICAL IMPRESSION: Patient is a *** y.o. *** who was seen today for physical therapy evaluation and treatment for ***.   OBJECTIVE IMPAIRMENTS: {opptimpairments:25111}.   ACTIVITY LIMITATIONS: {activitylimitations:27494}  PARTICIPATION LIMITATIONS: {participationrestrictions:25113}  PERSONAL FACTORS: {Personal factors:25162} are also affecting patient's functional outcome.   REHAB POTENTIAL: {rehabpotential:25112}  CLINICAL DECISION MAKING: {clinical decision making:25114}  EVALUATION COMPLEXITY: {Evaluation complexity:25115}   GOALS: Goals reviewed with patient? {yes/no:20286}  SHORT TERM GOALS: Target date: {follow up:25551}  Pt will be independent with HEP to improve strength and decrease neck pain to improve pain-free function at home and work. Baseline: *** Goal status: INITIAL   LONG TERM GOALS: Target date: {follow up:25551}  Pt will *** Baseline:  Goal status: INITIAL  2.  Pt will decrease worst neck pain by at least 2 points on the NPRS in order to demonstrate clinically significant reduction in neck pain. Baseline: *** Goal status: INITIAL  3.  Pt will decrease NDI score by at least 19% in order demonstrate clinically significant reduction in neck pain/disability.       Baseline: *** Goal status: INITIAL  4.  *** Baseline: *** Goal status: INITIAL   PLAN: PT FREQUENCY:  1-2x/week  PT DURATION: 6 weeks  PLANNED INTERVENTIONS: Therapeutic exercises, Therapeutic activity, Neuromuscular re-education,  Balance training, Gait training, Patient/Family education, Self Care, Joint mobilization, Joint manipulation, Vestibular training, Canalith repositioning, Orthotic/Fit training, DME instructions, Dry Needling, Electrical stimulation, Spinal manipulation, Spinal mobilization, Cryotherapy, Moist heat, Taping, Traction, Ultrasound, Ionotophoresis 4mg /ml Dexamethasone , Manual therapy, and Re-evaluation.  PLAN FOR NEXT SESSION: ***   Venetia Endo, PT, DPT [phone]  Venetia ONEIDA Endo, PT 08/29/2024, 7:51 AM

## 2024-08-31 ENCOUNTER — Encounter: Admitting: Physical Therapy

## 2024-09-05 ENCOUNTER — Ambulatory Visit: Attending: Physician Assistant | Admitting: Physical Therapy

## 2024-09-05 DIAGNOSIS — M4722 Other spondylosis with radiculopathy, cervical region: Secondary | ICD-10-CM | POA: Diagnosis not present

## 2024-09-05 DIAGNOSIS — M6281 Muscle weakness (generalized): Secondary | ICD-10-CM | POA: Insufficient documentation

## 2024-09-05 DIAGNOSIS — M542 Cervicalgia: Secondary | ICD-10-CM | POA: Diagnosis present

## 2024-09-05 NOTE — Therapy (Unsigned)
 OUTPATIENT PHYSICAL THERAPY NECK EVALUATION   Patient Name: Julia Hoover MRN: [id number] DOB:1966-03-08, 58 y.o., female Today's Date: 09/05/2024  END OF SESSION:   Past Medical History:  Diagnosis Date   Alcohol abuse    Anemia    Anxiety    Arthralgia of multiple joints    Arthritis    neck (noi limitations), fingers   Asthma    Bulging lumbar disc    Bursitis of both hips    Constipation    Degenerative joint disease (DJD) of lumbar spine    Depression    Dysmenorrhea    Dysrhythmia    had irreg beat approx 12 years ago after taking ecstasy.  none since.   Fibroids    Fibromyalgia    Greater trochanteric bursitis of both hips    Hepatitis    Hep B - as a child   Menorrhagia    Numbness in both hands    upon awakening, then goes away   Osteoporosis    PTSD (post-traumatic stress disorder)    Rotator cuff tear, right 06/2022   Scoliosis    Past Surgical History:  Procedure Laterality Date   BREAST BIOPSY Right    neg   BREAST CYST ASPIRATION Right 2013   neg   CHOLECYSTECTOMY     KNEE SURGERY Right    thickening of bands   NOSE SURGERY     fracture as a child   SHOULDER ARTHROSCOPY WITH ROTATOR CUFF REPAIR AND SUBACROMIAL DECOMPRESSION Right 06/20/2022   Procedure: Right shoulder arthroscopic vs mini-open rotator cuff repair, biceps tenodesis, distal clavicle excision, subacromial decompression;  Surgeon: Tobie Priest, MD;  Location: [facility];  Service: Orthopedics;  Laterality: Right;   TONSILLECTOMY     TUBAL LIGATION     ULNAR COLLATERAL LIGAMENT RECONSTRUCTION Left 2009   There are no active problems to display for this patient.   PCP: Julia Gaetana CROME, NP  REFERRING PROVIDER: Ulis Bottcher, PA-C  REFERRING DIAG: [phone] (ICD-10-CM) - Osteoarthritis of spine with radiculopathy, cervical region   RATIONALE FOR EVALUATION AND TREATMENT: Rehabilitation  THERAPY DIAG: No diagnosis found.  ONSET DATE: 8 years ago   FOLLOW-UP APPT  SCHEDULED WITH REFERRING PROVIDER: None currently scheduled   SUBJECTIVE:                                                                                                                                                                                         Chief Complaint: Pt is a 58 year old female referred for chronic neck pain with comorbid RUE paresthesias.   Pertinent History Pt is a 58 year old female referred for chronic neck pain with comorbid  RUE paresthesias.   [name] is a pleasant 58 y.o. female with known osteoporosis, fibromyalgia, and PTSD; pt has worsening neck pain in the setting of known degenerative disc disease and right foraminal stenosis previous seen on MRI.  Pain extends up to her head she is also been experiencing new numbness and tingling in all 5 digits of her right hand.  Numbness and tingling is worse at night when she is holding her phone.  She often has to shake her hand out to get rid of the numbness and tingling.  She feels as though her range of motion has decreased.   Patient currently reports pain along paracervical and bilat UE region. 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 Patient reports having L shoulder re-injury. Hx of R RCR. Hx of L cubital tunnel Sx; hx of L carpal tunnel release.   Pt uses chronic methotrexate, though she has tested negative for rheumatoid factor.   Pain:  Pain Intensity: Present: /10, Best: /10, Worst: /10 Pain location: L paracervical and down to L UT, R side was bad but this is less now; L upper arm pain and pain into L dorsal/lateral elbow Pain Quality: severe ache  Radiating: Yes ; down L UT/L arm Numbness/Tingling: Yes; N/T affecting digits 3-5 and some in 1st digit Focal Weakness: No; Aggravating factors: holding phone, looking down, has to sleep with R arm externally rotated to not go numb; vacuuming Relieving factors: keeping head up, phone elevated,  roll-on topical analgesic  (lidocaine /menthol), Salonpas, heat  24-hour pain behavior: worse in AM, worse when first attempting to go to sleep  History of prior neck injury, pain, surgery, or therapy: Yes; R RCR, cubital and carpal tunnel release L upper, hx of R shoulder fracture (15 years ago)  Dominant hand: right  Imaging: Yes ;  CLINICAL DATA:  Neck and right arm pain for 6 months.   EXAM: MRI CERVICAL SPINE WITHOUT CONTRAST   TECHNIQUE: Multiplanar, multisequence MR imaging of the cervical spine was performed. No intravenous contrast was administered.   COMPARISON:  None Available.   FINDINGS: Alignment: Normal   Vertebrae: Normal marrow signal. No bone lesions or fractures.   Cord: Normal cord signal intensity. No cord lesions or syrinx.   Posterior Fossa, vertebral arteries, paraspinal tissues: No significant findings.   Disc levels:   C2-3: No significant findings.   C3-4: No significant findings.   C4-5: Shallow left-sided disc osteophyte complex with mild lateral recess foraminal encroachment but no significant stenosis.   C5-6: Degenerative disc disease with bilateral disc osteophyte complexes, right greater than left. Moderate right and mild left foraminal stenosis and mild bilateral lateral recess stenosis. No significant spinal stenosis.   C6-7: Shallow left-sided disc osteophyte complex with mild left foraminal encroachment.   C7-T1: No significant findings.   IMPRESSION: 1. Shallow left-sided disc osteophyte complex at C4-5 with mild lateral recess foraminal encroachment. 2. Degenerative disc disease at C5-6 with bilateral disc osteophyte complexes, right greater than left. Moderate right and mild left foraminal stenosis and mild bilateral lateral recess stenosis. 3. Shallow left-sided disc osteophyte complex at C6-7 with mild left foraminal encroachment.    Red flags (personal history of cancer, h/o spinal tumors, history of compression fracture, chills/fever, night  sweats, nausea, vomiting, unrelenting pain): Negative  PRECAUTIONS: Osteoporosis  WEIGHT BEARING RESTRICTIONS: No  FALLS: Has patient fallen in last 6 months? No; pt had remote fall in Nov 2024 when tripping over item walking fast. Pt reports  slowing down due to imbalance. Pt reports notable fear of falls   Living Environment Lives with: lives alone Lives in: House/apartment Stairs: 4 steps to get into house  Has following equipment at home: None  Prior level of function: Independent  Occupational demands: Retired - medical   Hobbies: Fishing, gardening   Patient Goals: Muscles more loosened up, improve fibro symptoms along traps/periscapular region   OBJECTIVE:   Patient Surveys  NDI:  NECK DISABILITY INDEX  Date: 09/05/24 Score  Pain intensity {NDI-1:32931}  2. Personal care (washing, dressing, etc.) {NDI-2:32932}  3. Lifting {NDI-3:32933}  4. Reading {NDI-4:32934}  5. Headaches {NDI-5:32935}  6. Concentration {NDI-6:32936}  7. Work {NDI-7:32937}  8. Driving {WIP-1:67061}  9. Sleeping {NDI-9:32939}  10. Recreation {NDI-10:32940}  Total ***/50   Minimum Detectable Change (90% confidence): 5 points or 10% points  Cognition Patient is oriented to person, place, and time.  Recent memory is intact.  Remote memory is intact.  Attention span and concentration are intact.  Expressive speech is intact.  Patient's fund of knowledge is within normal limits for educational level.    Gross Musculoskeletal Assessment Tremor: None Bulk: Normal Tone: Normal   Gait   Posture Dec lumbar lordosis Kyphotic psture in static sitting/standing FHRS  AROM AROM (Normal range in degrees) AROM 09/05/24  Cervical  Flexion (50) 35*  Extension (80) 20* (tense posterior paraspinals)  Right lateral flexion (45) 26 (tight L UT)  Left lateral flexion (45) 21  Right rotation (85) 55  Left rotation (85) 45* (more pain rotating L versus R)  (* = pain; Blank rows = not  tested)  Shoulder AROM Flexion: 120 deg bilat Abduction: 120 deg bilat ER (at side): WFL bilat   MMT MMT (out of 5) Right Left      Shoulder   Flexion 4* 4*  Extension    Abduction 4-* 4-  Internal rotation 4+* 4+*  External rotation 4- 4-  Horizontal abduction    Horizontal adduction    Lower Trapezius    Rhomboids        Elbow  Flexion 5 5*  Extension 5 4+  Pronation    Supination        Wrist  Flexion    Extension    Radial deviation    Ulnar deviation        MCP  Flexion    Extension    Abduction    Adduction    (* = pain; Blank rows = not tested)  Sensation Grossly intact to light touch bilateral UE as determined by testing dermatomes C2-T2. Proprioception and hot/cold testing deferred on this date.  Reflexes R/L Elbow: 2+/3+  Brachioradialis: 2+/3+  Tricep: 2+/2+ Clonus: Negative bilat   Palpation Location LEFT  RIGHT           Suboccipitals    Cervical paraspinals    Upper Trapezius    Levator Scapulae    Rhomboid Major/Minor    (Blank rows = not tested) Graded on 0-4 scale (0 = no pain, 1 = pain, 2 = pain with wincing/grimacing/flinching, 3 = pain with withdrawal, 4 = unwilling to allow palpation), (Blank rows = not tested)  Repeated Movements No centralization or peripheralization of symptoms with repeated cervical protraction and retraction.   Passive Accessory Intervertebral Motion Pt denies reproduction of neck pain with CPA C2-T3 and UPA bilaterally C2-T3. Generally, hypomobile throughout   SPECIAL TESTS Spurlings A (ipsilateral lateral flexion/axial compression): R: {NEGATIVE/POSITIVE QNM:80001} L: {NEGATIVE/POSITIVE QNM:80001} Spurlings B (ipsilateral lateral flexion/contralateral  rotation/axial compression): R: {NEGATIVE/POSITIVE QNM:80001} L: {NEGATIVE/POSITIVE QNM:80001} Distraction Test: {NEGATIVE/POSITIVE QNM:80001}  Hoffman Sign (cervical cord compression): R: Negative L: Negative ULTT Median: R: {NEGATIVE/POSITIVE  QNM:80001} L: {NEGATIVE/POSITIVE QNM:80001} ULTT Ulnar: R: {NEGATIVE/POSITIVE QNM:80001} L: {NEGATIVE/POSITIVE QNM:80001} ULTT Radial: R: {NEGATIVE/POSITIVE QNM:80001} L: {NEGATIVE/POSITIVE QNM:80001}  Clinical Prediction Rules  Diagnostic Cervical Radiculopathy ULTTa: Any one of the following: A) symptom reproduction; B) side-to-side difference >10 degrees in elbow extension; or C) with regard to involved/painful side: ipsilateral neck lateral flexion decreases symptoms and/or contralateral neck lateral flexion increases symptoms.  ROM: involved-side cervical rotation range of motion less than 60 degrees Distraction test: symptom reduction.  Spurling's A: symptom reproduction.   Number of Positive Criteria  Sensitivity  Specificity  Pos LR  Neg LR   Two  0.39 0.56 0.88 1.09   Three  0.39  0.94  6.1 0.65   Four  0.24 0.99  30.3 0.77   Pos LR = positive likelihood ratio. Neg LR = negative likelihood ratio.    Interventional  Cervical Manipulation 1. Symptom duration of less than 38 days 2. Positive expectation that manipulation will help 3. Side-to-side difference in cervical rotation ROM of 10 or greater 4. Pain with posteroanterior spring testing of the middle cervical spine If at least 3 attributes were present (+LR, 13.5), the probability of experiencing a successful outcome is 90%. IMPLICATIONS: The findi  Cervical Traction patient reported periperalization with lower cervical spine (C4-7) mobility testing,  positive shoulder abduction test,  age > 55,  positive upper limb tension test A,  positive neck distraction test  Number of Positive Criteria  Sensitivity  Specificity  Pos LR  Neg LR  Probability of Success w/ traction + exercise  One 0.07 0.97 1.15 0.21 47.6%  Two  0.3 0.97 1.44 0.40 53.2%  Three 0.63 0.87 4.81 0.42 79.2%  Four  0.3 1.0 23.1 0.71 94.8%  Pos LR = positive likelihood ratio. Neg LR = negative likelihood ratio.  Having at least 3 out of 5 predictors  appears to be the optimal threshold for choosing the cervical traction as an intervention.    TODAY'S TREATMENT  ***   PATIENT EDUCATION:  Education details: *** Person educated: {Person educated:25204} Education method: {Education Method:25205} Education comprehension: {Education Comprehension:25206}   HOME EXERCISE PROGRAM:  Access Code: [email or web address] URL: [email or web address] Date: 09/05/2024 Prepared by: Venetia Endo  Exercises - Seated Upper Trapezius Stretch  - 2 x daily - 7 x weekly - 3 sets - 30sec hold - Seated Levator Scapulae Stretch  - 2 x daily - 7 x weekly - 3 sets - 30sec hold - Seated Scapular Retraction  - 2 x daily - 7 x weekly - 2 sets - 10 reps - 3sec hold  ASSESSMENT:  CLINICAL IMPRESSION: Patient is a *** y.o. *** who was seen today for physical therapy evaluation and treatment for ***.   OBJECTIVE IMPAIRMENTS: {opptimpairments:25111}.   ACTIVITY LIMITATIONS: {activitylimitations:27494}  PARTICIPATION LIMITATIONS: {participationrestrictions:25113}  PERSONAL FACTORS: {Personal factors:25162} are also affecting patient's functional outcome.   REHAB POTENTIAL: {rehabpotential:25112}  CLINICAL DECISION MAKING: {clinical decision making:25114}  EVALUATION COMPLEXITY: {Evaluation complexity:25115}   GOALS: Goals reviewed with patient? {yes/no:20286}  SHORT TERM GOALS: Target date: {follow up:25551}  Pt will be independent with HEP to improve strength and decrease neck pain to improve pain-free function at home and work. Baseline: *** Goal status: INITIAL   LONG TERM GOALS: Target date: {follow up:25551}  Pt will *** Baseline:  Goal status: INITIAL  2.  Pt will decrease worst  neck pain by at least 2 points on the NPRS in order to demonstrate clinically significant reduction in neck pain. Baseline: *** Goal status: INITIAL  3.  Pt will decrease NDI score by at least 19% in order demonstrate clinically significant reduction  in neck pain/disability.       Baseline: *** Goal status: INITIAL  4.  *** Baseline: *** Goal status: INITIAL   PLAN: PT FREQUENCY: 1-2x/week  PT DURATION: 6 weeks  PLANNED INTERVENTIONS: Therapeutic exercises, Therapeutic activity, Neuromuscular re-education, Balance training, Gait training, Patient/Family education, Self Care, Joint mobilization, Joint manipulation, Vestibular training, Canalith repositioning, Orthotic/Fit training, DME instructions, Dry Needling, Electrical stimulation, Spinal manipulation, Spinal mobilization, Cryotherapy, Moist heat, Taping, Traction, Ultrasound, Ionotophoresis 4mg /ml Dexamethasone , Manual therapy, and Re-evaluation.  PLAN FOR NEXT SESSION: ***   Venetia Endo, PT, DPT [phone]  Venetia ONEIDA Endo, PT 09/05/2024, 12:46 PM

## 2024-09-06 ENCOUNTER — Encounter: Payer: Self-pay | Admitting: Physical Therapy

## 2024-09-07 ENCOUNTER — Encounter: Payer: Self-pay | Admitting: Physical Therapy

## 2024-09-07 ENCOUNTER — Ambulatory Visit: Admitting: Physical Therapy

## 2024-09-12 ENCOUNTER — Ambulatory Visit: Attending: Physician Assistant | Admitting: Physical Therapy

## 2024-09-12 DIAGNOSIS — M542 Cervicalgia: Secondary | ICD-10-CM | POA: Insufficient documentation

## 2024-09-12 DIAGNOSIS — M6281 Muscle weakness (generalized): Secondary | ICD-10-CM | POA: Insufficient documentation

## 2024-09-12 NOTE — Therapy (Deleted)
 OUTPATIENT PHYSICAL THERAPY NECK EVALUATION   Patient Name: Julia Hoover MRN: [id number] DOB:20-Dec-1965, 58 y.o., female Today's Date: 09/12/2024  END OF SESSION:    Past Medical History:  Diagnosis Date   Alcohol abuse    Anemia    Anxiety    Arthralgia of multiple joints    Arthritis    neck (noi limitations), fingers   Asthma    Bulging lumbar disc    Bursitis of both hips    Constipation    Degenerative joint disease (DJD) of lumbar spine    Depression    Dysmenorrhea    Dysrhythmia    had irreg beat approx 12 years ago after taking ecstasy.  none since.   Fibroids    Fibromyalgia    Greater trochanteric bursitis of both hips    Hepatitis    Hep B - as a child   Menorrhagia    Numbness in both hands    upon awakening, then goes away   Osteoporosis    PTSD (post-traumatic stress disorder)    Rotator cuff tear, right 06/2022   Scoliosis    Past Surgical History:  Procedure Laterality Date   BREAST BIOPSY Right    neg   BREAST CYST ASPIRATION Right 2013   neg   CHOLECYSTECTOMY     KNEE SURGERY Right    thickening of bands   NOSE SURGERY     fracture as a child   SHOULDER ARTHROSCOPY WITH ROTATOR CUFF REPAIR AND SUBACROMIAL DECOMPRESSION Right 06/20/2022   Procedure: Right shoulder arthroscopic vs mini-open rotator cuff repair, biceps tenodesis, distal clavicle excision, subacromial decompression;  Surgeon: Tobie Priest, MD;  Location: [facility];  Service: Orthopedics;  Laterality: Right;   TONSILLECTOMY     TUBAL LIGATION     ULNAR COLLATERAL LIGAMENT RECONSTRUCTION Left 2009   There are no active problems to display for this patient.   PCP: Harvey Gaetana CROME, NP  REFERRING PROVIDER: Ulis Bottcher, PA-C  REFERRING DIAG: [phone] (ICD-10-CM) - Osteoarthritis of spine with radiculopathy, cervical region   RATIONALE FOR EVALUATION AND TREATMENT: Rehabilitation  THERAPY DIAG: Cervicalgia  Muscle weakness (generalized)  ONSET DATE: 8 years  ago   FOLLOW-UP APPT SCHEDULED WITH REFERRING PROVIDER: None currently scheduled   SUBJECTIVE:                                                                                                                                                                                         Chief Complaint: Pt is a 58 year old female referred for chronic neck pain with associated RUE paresthesia.   Pertinent History Pt is a 58 year old female referred for chronic neck  pain with comorbid RUE paresthesia.   [name] is a pleasant 58 y.o. female with known osteoporosis, fibromyalgia, and PTSD; pt has worsening neck pain in the setting of known degenerative disc disease and right foraminal stenosis previous seen on MRI. Pain extends up to her head she has also been experiencing new numbness and tingling in all 5 digits of her right hand.  Numbness and tingling is worse at night when she is holding her phone.  She often has to shake her hand out to get rid of the numbness and tingling.  She feels as though her range of motion has decreased.   Patient currently reports pain along paracervical and bilat UE region. 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 Patient reports having L shoulder re-injury. Hx of R RCR. Surgical Hx of L ulnar collateral ligament reconstruction, L carpal tunnel release.   Pt uses chronic methotrexate, though she has tested negative for rheumatoid factor.   Pain:  Pain Intensity: Present: 6/10, Worst: 10/10 Pain location: L paracervical and down to L UT, R side was bad but this is less now; L upper arm pain and pain into L dorsal/lateral elbow Pain Quality: severe ache  Radiating: Yes ; down L UT/L arm Numbness/Tingling: Yes; N/T affecting digits 3-5 and some in 1st digit Focal Weakness: No; Aggravating factors: holding phone, looking down, has to sleep with R arm externally rotated to not go numb; vacuuming Relieving factors: keeping head up, phone  elevated,  roll-on topical analgesic (lidocaine /menthol), Salonpas, heat  24-hour pain behavior: worse in AM, worse when first attempting to go to sleep  History of prior neck injury, pain, surgery, or therapy: Yes; R RCR, L carpal tunnel release, L ulnar collateral ligament reconstruction; hx of R shoulder fracture (15 years ago)  Dominant hand: right  Imaging: Yes ;  CLINICAL DATA:  Neck and right arm pain for 6 months.   EXAM: MRI CERVICAL SPINE WITHOUT CONTRAST   TECHNIQUE: Multiplanar, multisequence MR imaging of the cervical spine was performed. No intravenous contrast was administered.   COMPARISON:  None Available.   FINDINGS: Alignment: Normal   Vertebrae: Normal marrow signal. No bone lesions or fractures.   Cord: Normal cord signal intensity. No cord lesions or syrinx.   Posterior Fossa, vertebral arteries, paraspinal tissues: No significant findings.   Disc levels:   C2-3: No significant findings.   C3-4: No significant findings.   C4-5: Shallow left-sided disc osteophyte complex with mild lateral recess foraminal encroachment but no significant stenosis.   C5-6: Degenerative disc disease with bilateral disc osteophyte complexes, right greater than left. Moderate right and mild left foraminal stenosis and mild bilateral lateral recess stenosis. No significant spinal stenosis.   C6-7: Shallow left-sided disc osteophyte complex with mild left foraminal encroachment.   C7-T1: No significant findings.   IMPRESSION: 1. Shallow left-sided disc osteophyte complex at C4-5 with mild lateral recess foraminal encroachment. 2. Degenerative disc disease at C5-6 with bilateral disc osteophyte complexes, right greater than left. Moderate right and mild left foraminal stenosis and mild bilateral lateral recess stenosis. 3. Shallow left-sided disc osteophyte complex at C6-7 with mild left foraminal encroachment.  Red flags (personal history of cancer, h/o spinal  tumors, history of compression fracture, chills/fever, night sweats, nausea, vomiting, unrelenting pain): Negative   PRECAUTIONS: Osteoporosis  WEIGHT BEARING RESTRICTIONS: No  FALLS: Has patient fallen in last 6 months? No; pt had remote fall in Nov 2024 when tripping over item walking fast. Pt  reports slowing down due to imbalance. Pt reports notable fear of falls   Living Environment Lives with: lives alone Lives in: House/apartment Stairs: 4 steps to get into house  Has following equipment at home: None  Prior level of function: Independent  Occupational demands: Retired - medical   Hobbies: Fishing, gardening   Patient Goals: Muscles more loosened up, improve fibro symptoms along traps/periscapular region    OBJECTIVE:   Patient Surveys  NDI:  NECK DISABILITY INDEX  Date: 09/05/24 Score  Pain intensity 3 = The pain is fairly severe at the moment  2. Personal care (washing, dressing, etc.) 2 = It is painful to look after myself and I am slow and careful  3. Lifting 4 =  I can only lift very light weights  4. Reading 4 =  I can hardly read at all because of severe pain in my neck  5. Headaches 4 = I have severe headaches, which come frequently   6. Concentration 4 =  I have a great deal of difficulty in concentrating when I want to  7. Work 4 = I can hardly do any work at all  8. Driving 2 =  I can drive my car as long as I want with moderate pain in my neck  9. Sleeping 1 = My sleep is slightly disturbed (less than 1 hr sleepless)  10. Recreation 4 =  I can hardly do any recreation activities because of pain in my neck  Total 32/50    Cognition Patient is oriented to person, place, and time.  Recent memory is intact.  Remote memory is intact.  Attention span and concentration are intact.  Expressive speech is intact.  Patient's fund of knowledge is within normal limits for educational level.    Gross Musculoskeletal Assessment Tremor: None Bulk: Normal Tone:  Normal  Posture Dec lumbar lordosis Kyphotic psture in static sitting/standing FHRS  AROM AROM (Normal range in degrees) AROM 09/05/24  Cervical  Flexion (50) 35*  Extension (80) 20* (tense posterior paraspinals)  Right lateral flexion (45) 26 (tight L UT)  Left lateral flexion (45) 21  Right rotation (85) 55  Left rotation (85) 45* (more pain rotating L versus R)  (* = pain; Blank rows = not tested)  Shoulder AROM Flexion: 120 deg bilat Abduction: 120 deg bilat ER (at side): WFL bilat   MMT MMT (out of 5) Right 09/05/24 Left 09/05/24      Shoulder   Flexion 4* 4*  Extension    Abduction 4-* 4-  Internal rotation 4+* 4+*  External rotation 4- 4-  Horizontal abduction    Horizontal adduction    Lower Trapezius    Rhomboids        Elbow  Flexion 5 5*  Extension 5 4+  Pronation    Supination        Wrist  Flexion    Extension 5 5  Radial deviation    Ulnar deviation        (* = pain; Blank rows = not tested)  Sensation Grossly intact to light touch bilateral UE as determined by testing dermatomes C2-T2. Proprioception and hot/cold testing deferred on this date.  Reflexes R/L Elbow: 2+/3+  Brachioradialis: 2+/3+  Tricep: 2+/2+ Clonus: Negative bilat   Palpation Location LEFT  RIGHT           Suboccipitals 1 1  Cervical paraspinals 1 1  Upper Trapezius 1 1  Levator Scapulae 2 2  Rhomboid Major/Minor 2 2  (  Blank rows = not tested) Graded on 0-4 scale (0 = no pain, 1 = pain, 2 = pain with wincing/grimacing/flinching, 3 = pain with withdrawal, 4 = unwilling to allow palpation), (Blank rows = not tested)   Repeated Movements ***  Passive Accessory Intervertebral Motion ***   SPECIAL TESTS Spurlings A (ipsilateral lateral flexion/axial compression): R: Negative L: Positive Distraction Test: Positive  Hoffman Sign (cervical cord compression): R: Negative L: Negative ULTT Median: R: *** L: *** ULTT Ulnar: R: *** L:  *** ULTT Radial: R: ***  L: ***     TODAY'S TREATMENT    09/12/2024   SUBJECTIVE STATEMENT:      Therapeutic Exercise - for improved soft tissue flexibility and extensibility as needed for ROM, improved strength as needed to improve performance of CKC activities/functional movements  Repeated motion testing:   Manual Therapy - for symptom modulation, soft tissue sensitivity and mobility, joint mobility, ROM    Manual cervical traction in supine; 10 sec on, 10 sec off; x 5 minutes for nerve root decompression and symptom modulation  Updated ULTT STM/DTM  Neuromuscular Re-education - for nervous system downregulation, desensitization to modulate pain            PATIENT EDUCATION:  Education details: see above for patient education details Person educated: Patient Education method: Explanation, Demonstration, and Handouts Education comprehension: verbalized understanding and returned demonstration   HOME EXERCISE PROGRAM:  Access Code: [email or web address] URL: [email or web address] Date: 09/05/2024 Prepared by: Venetia Endo  Exercises - Seated Upper Trapezius Stretch  - 2 x daily - 7 x weekly - 3 sets - 30sec hold - Seated Levator Scapulae Stretch  - 2 x daily - 7 x weekly - 3 sets - 30sec hold - Seated Scapular Retraction  - 2 x daily - 7 x weekly - 2 sets - 10 reps - 3sec hold   ASSESSMENT:  CLINICAL IMPRESSION: Patient is a 58 y.o. female who was seen today for physical therapy evaluation and treatment for neck pain with R upper limb paresthesias largely affecting C7-8 distribution with intermittent involvement in thumb/C6. Clinical history and presentation is consistent with cervical radiculopathy. Pt has relief with distraction testing; pain is reproduced with Spurling's, but no paresthesias are reproduced with provocative testing. Pt does not have significant motor weakness, and MMTs are more limited due to pain in setting of fibromyalgia, chronic pain, and Hx of RCR. Pt has  negative red flag screen, but significant contextual factors affecting prognosis/POC. Pt has current deficits in: cervical spine and shoulder AROM, deltoid/RTC/periscapular strength, postural changes, taut/tender paracervical and periscapular mm, and paracervical pain with associated R upper limb paresthesias. Pt will continue to benefit from skilled PT services to address deficits and improve function.  OBJECTIVE IMPAIRMENTS: decreased ROM, decreased strength, hypomobility, impaired flexibility, impaired UE functional use, postural dysfunction, and pain.   ACTIVITY LIMITATIONS: carrying, lifting, bending, sitting, sleeping, reach over head, and hygiene/grooming  PARTICIPATION LIMITATIONS: cleaning, laundry, and community activity  PERSONAL FACTORS: Past/current experiences, Time since onset of injury/illness/exacerbation, and 3+ comorbidities: (anxiety, depression, osteoporosis, PTSD, fibromyalgia) are also affecting patient's functional outcome.   REHAB POTENTIAL: Good  CLINICAL DECISION MAKING: Unstable/unpredictable  EVALUATION COMPLEXITY: High   GOALS: Goals reviewed with patient? Yes  SHORT TERM GOALS: Target date: 09/30/2024  Pt will be independent with HEP to improve strength and decrease neck pain to improve pain-free function at home and work. Baseline: 09/05/24: Baseline HEP initiated and handout given.  Goal status: INITIAL   LONG TERM GOALS: Target date:  10/27/2024  Pt will have functional cervical spine ROM in all directions without reproduction of upper quarter pain as needed for scanning environment, driving, and completion of overhead tasks Baseline: 09/05/24: Motion loss in all planes, pain with flexion/extension/R lateral flexion/L rotation  Goal status: INITIAL  2.  Pt will decrease worst neck pain by at least 2 points on the NPRS in order to demonstrate clinically significant reduction in neck pain. Baseline: 09/05/24: 10/10 at worst.  Goal status:  INITIAL  3.  Pt will decrease NDI score by at least 5 points or 10% in order demonstrate clinically significant reduction in neck pain/disability.       Baseline: 09/05/24: 32/50 Goal status: INITIAL  4.  Patient will have MMT 4+/5 or greater for all tested upper limb muscle groups indicative of improved strength as needed for lifting/carrying tasks and improved tolerance to load with completion of household/daily activities Baseline: 09/05/24: Shoulder flexion, abduction, ER 4 to 4-/5 with pain reproduction (see chart above).  Goal status: INITIAL   PLAN: PT FREQUENCY: 1-2x/week  PT DURATION: 6 weeks  PLANNED INTERVENTIONS: Therapeutic exercises, Therapeutic activity, Neuromuscular re-education, Balance training, Gait training, Patient/Family education, Self Care, Joint mobilization, Joint manipulation, Vestibular training, Canalith repositioning, Orthotic/Fit training, DME instructions, Dry Needling, Electrical stimulation, Spinal manipulation, Spinal mobilization, Cryotherapy, Moist heat, Taping, Traction, Ultrasound, Ionotophoresis 4mg /ml Dexamethasone , Manual therapy, and Re-evaluation.  PLAN FOR NEXT SESSION: Repeated motion testing. Upper limb tension testing. Manual traction and STM for symptom modulation. Check passive accessories. Integrate pain neuroscience education with PT POC. Progress C-spine ROM as tolerated.    Venetia Endo, PT, DPT #[id number]  Venetia ONEIDA Endo, PT 09/12/2024, 12:11 PM

## 2024-09-14 ENCOUNTER — Ambulatory Visit: Admitting: Physical Therapy

## 2024-09-14 NOTE — Therapy (Deleted)
 OUTPATIENT PHYSICAL THERAPY NECK TREATMENT   Patient Name: Julia Hoover MRN: [id number] DOB:[birth year], 58 y.o., female Today's Date: 09/14/2024  END OF SESSION:    Past Medical History:  Diagnosis Date   Alcohol abuse    Anemia    Anxiety    Arthralgia of multiple joints    Arthritis    neck (noi limitations), fingers   Asthma    Bulging lumbar disc    Bursitis of both hips    Constipation    Degenerative joint disease (DJD) of lumbar spine    Depression    Dysmenorrhea    Dysrhythmia    had irreg beat approx 12 years ago after taking ecstasy.  none since.   Fibroids    Fibromyalgia    Greater trochanteric bursitis of both hips    Hepatitis    Hep B - as a child   Menorrhagia    Numbness in both hands    upon awakening, then goes away   Osteoporosis    PTSD (post-traumatic stress disorder)    Rotator cuff tear, right 06/2022   Scoliosis    Past Surgical History:  Procedure Laterality Date   BREAST BIOPSY Right    neg   BREAST CYST ASPIRATION Right 2013   neg   CHOLECYSTECTOMY     KNEE SURGERY Right    thickening of bands   NOSE SURGERY     fracture as a child   SHOULDER ARTHROSCOPY WITH ROTATOR CUFF REPAIR AND SUBACROMIAL DECOMPRESSION Right 06/20/2022   Procedure: Right shoulder arthroscopic vs mini-open rotator cuff repair, biceps tenodesis, distal clavicle excision, subacromial decompression;  Surgeon: Tobie Priest, MD;  Location: [facility];  Service: Orthopedics;  Laterality: Right;   TONSILLECTOMY     TUBAL LIGATION     ULNAR COLLATERAL LIGAMENT RECONSTRUCTION Left 2009   There are no active problems to display for this patient.   PCP: Harvey Gaetana CROME, NP  REFERRING PROVIDER: Ulis Bottcher, PA-C  REFERRING DIAG: [id number] (ICD-10-CM) - Osteoarthritis of spine with radiculopathy, cervical region   RATIONALE FOR EVALUATION AND TREATMENT: Rehabilitation  THERAPY DIAG: Cervicalgia  Muscle weakness (generalized)  ONSET DATE: 8 years ago    FOLLOW-UP APPT SCHEDULED WITH REFERRING PROVIDER: None currently scheduled   SUBJECTIVE:                                                                                                                                                                                         Chief Complaint: Pt is a 58 year old female referred for chronic neck pain with associated RUE paresthesia.   Pertinent History Pt is a 59 year old female referred for chronic neck  pain with comorbid RUE paresthesia.   Julia Hoover is a pleasant 58 y.o. female with known osteoporosis, fibromyalgia, and PTSD; pt has worsening neck pain in the setting of known degenerative disc disease and right foraminal stenosis previous seen on MRI. Pain extends up to her head she has also been experiencing new numbness and tingling in all 5 digits of her right hand.  Numbness and tingling is worse at night when she is holding her phone.  She often has to shake her hand out to get rid of the numbness and tingling.  She feels as though her range of motion has decreased.   Patient currently reports pain along paracervical and bilat UE region. 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 Patient reports having L shoulder re-injury. Hx of R RCR. Surgical Hx of L ulnar collateral ligament reconstruction, L carpal tunnel release.   Pt uses chronic methotrexate, though she has tested negative for rheumatoid factor.   Pain:  Pain Intensity: Present: 6/10, Worst: 10/10 Pain location: L paracervical and down to L UT, R side was bad but this is less now; L upper arm pain and pain into L dorsal/lateral elbow Pain Quality: severe ache  Radiating: Yes ; down L UT/L arm Numbness/Tingling: Yes; N/T affecting digits 3-5 and some in 1st digit Focal Weakness: No; Aggravating factors: holding phone, looking down, has to sleep with R arm externally rotated to not go numb; vacuuming Relieving factors: keeping head up, phone  elevated,  roll-on topical analgesic (lidocaine /menthol), Salonpas, heat  24-hour pain behavior: worse in AM, worse when first attempting to go to sleep  History of prior neck injury, pain, surgery, or therapy: Yes; R RCR, L carpal tunnel release, L ulnar collateral ligament reconstruction; hx of R shoulder fracture (15 years ago)  Dominant hand: right  Imaging: Yes ;  CLINICAL DATA:  Neck and right arm pain for 6 months.   EXAM: MRI CERVICAL SPINE WITHOUT CONTRAST   TECHNIQUE: Multiplanar, multisequence MR imaging of the cervical spine was performed. No intravenous contrast was administered.   COMPARISON:  None Available.   FINDINGS: Alignment: Normal   Vertebrae: Normal marrow signal. No bone lesions or fractures.   Cord: Normal cord signal intensity. No cord lesions or syrinx.   Posterior Fossa, vertebral arteries, paraspinal tissues: No significant findings.   Disc levels:   C2-3: No significant findings.   C3-4: No significant findings.   C4-5: Shallow left-sided disc osteophyte complex with mild lateral recess foraminal encroachment but no significant stenosis.   C5-6: Degenerative disc disease with bilateral disc osteophyte complexes, right greater than left. Moderate right and mild left foraminal stenosis and mild bilateral lateral recess stenosis. No significant spinal stenosis.   C6-7: Shallow left-sided disc osteophyte complex with mild left foraminal encroachment.   C7-T1: No significant findings.   IMPRESSION: 1. Shallow left-sided disc osteophyte complex at C4-5 with mild lateral recess foraminal encroachment. 2. Degenerative disc disease at C5-6 with bilateral disc osteophyte complexes, right greater than left. Moderate right and mild left foraminal stenosis and mild bilateral lateral recess stenosis. 3. Shallow left-sided disc osteophyte complex at C6-7 with mild left foraminal encroachment.  Red flags (personal history of cancer, h/o spinal  tumors, history of compression fracture, chills/fever, night sweats, nausea, vomiting, unrelenting pain): Negative   PRECAUTIONS: Osteoporosis  WEIGHT BEARING RESTRICTIONS: No  FALLS: Has patient fallen in last 6 months? No; pt had remote fall in Nov 2024 when tripping over item walking fast. Pt  reports slowing down due to imbalance. Pt reports notable fear of falls   Living Environment Lives with: lives alone Lives in: House/apartment Stairs: 4 steps to get into house  Has following equipment at home: None  Prior level of function: Independent  Occupational demands: Retired - medical   Hobbies: Fishing, gardening   Patient Goals: Muscles more loosened up, improve fibro symptoms along traps/periscapular region    OBJECTIVE:   Patient Surveys  NDI:  NECK DISABILITY INDEX  Date: 09/05/24 Score  Pain intensity 3 = The pain is fairly severe at the moment  2. Personal care (washing, dressing, etc.) 2 = It is painful to look after myself and I am slow and careful  3. Lifting 4 =  I can only lift very light weights  4. Reading 4 =  I can hardly read at all because of severe pain in my neck  5. Headaches 4 = I have severe headaches, which come frequently   6. Concentration 4 =  I have a great deal of difficulty in concentrating when I want to  7. Work 4 = I can hardly do any work at all  8. Driving 2 =  I can drive my car as long as I want with moderate pain in my neck  9. Sleeping 1 = My sleep is slightly disturbed (less than 1 hr sleepless)  10. Recreation 4 =  I can hardly do any recreation activities because of pain in my neck  Total 32/50    Cognition Patient is oriented to person, place, and time.  Recent memory is intact.  Remote memory is intact.  Attention span and concentration are intact.  Expressive speech is intact.  Patient's fund of knowledge is within normal limits for educational level.    Gross Musculoskeletal Assessment Tremor: None Bulk: Normal Tone:  Normal  Posture Dec lumbar lordosis Kyphotic psture in static sitting/standing FHRS  AROM AROM (Normal range in degrees) AROM 09/05/24  Cervical  Flexion (50) 35*  Extension (80) 20* (tense posterior paraspinals)  Right lateral flexion (45) 26 (tight L UT)  Left lateral flexion (45) 21  Right rotation (85) 55  Left rotation (85) 45* (more pain rotating L versus R)  (* = pain; Blank rows = not tested)  Shoulder AROM Flexion: 120 deg bilat Abduction: 120 deg bilat ER (at side): WFL bilat   MMT MMT (out of 5) Right 09/05/24 Left 09/05/24      Shoulder   Flexion 4* 4*  Extension    Abduction 4-* 4-  Internal rotation 4+* 4+*  External rotation 4- 4-  Horizontal abduction    Horizontal adduction    Lower Trapezius    Rhomboids        Elbow  Flexion 5 5*  Extension 5 4+  Pronation    Supination        Wrist  Flexion    Extension 5 5  Radial deviation    Ulnar deviation        (* = pain; Blank rows = not tested)  Sensation Grossly intact to light touch bilateral UE as determined by testing dermatomes C2-T2. Proprioception and hot/cold testing deferred on this date.  Reflexes R/L Elbow: 2+/3+  Brachioradialis: 2+/3+  Tricep: 2+/2+ Clonus: Negative bilat   Palpation Location LEFT  RIGHT           Suboccipitals 1 1  Cervical paraspinals 1 1  Upper Trapezius 1 1  Levator Scapulae 2 2  Rhomboid Major/Minor 2 2  (  Blank rows = not tested) Graded on 0-4 scale (0 = no pain, 1 = pain, 2 = pain with wincing/grimacing/flinching, 3 = pain with withdrawal, 4 = unwilling to allow palpation), (Blank rows = not tested)   Repeated Movements ***  Passive Accessory Intervertebral Motion ***   SPECIAL TESTS Spurlings A (ipsilateral lateral flexion/axial compression): R: Negative L: Positive Distraction Test: Positive  Hoffman Sign (cervical cord compression): R: Negative L: Negative ULTT Median: R: *** L: *** ULTT Ulnar: R: *** L:  *** ULTT Radial: R: ***  L: ***     TODAY'S TREATMENT    09/14/2024   SUBJECTIVE STATEMENT:      Therapeutic Exercise - for improved soft tissue flexibility and extensibility as needed for ROM, improved strength as needed to improve performance of CKC activities/functional movements  Repeated motion testing:   Manual Therapy - for symptom modulation, soft tissue sensitivity and mobility, joint mobility, ROM    Manual cervical traction in supine; 10 sec on, 10 sec off; x 5 minutes for nerve root decompression and symptom modulation  Updated ULTT STM/DTM  Neuromuscular Re-education - for nervous system downregulation, desensitization to modulate pain            PATIENT EDUCATION:  Education details: see above for patient education details Person educated: Patient Education method: Explanation, Demonstration, and Handouts Education comprehension: verbalized understanding and returned demonstration   HOME EXERCISE PROGRAM:  Access Code: [email or web address] URL: [email or web address] Date: 09/05/2024 Prepared by: Venetia Endo  Exercises - Seated Upper Trapezius Stretch  - 2 x daily - 7 x weekly - 3 sets - 30sec hold - Seated Levator Scapulae Stretch  - 2 x daily - 7 x weekly - 3 sets - 30sec hold - Seated Scapular Retraction  - 2 x daily - 7 x weekly - 2 sets - 10 reps - 3sec hold   ASSESSMENT:  CLINICAL IMPRESSION: Patient is a 58 y.o. female who was seen today for physical therapy evaluation and treatment for neck pain with R upper limb paresthesias largely affecting C7-8 distribution with intermittent involvement in thumb/C6. Clinical history and presentation is consistent with cervical radiculopathy. Pt has relief with distraction testing; pain is reproduced with Spurling's, but no paresthesias are reproduced with provocative testing. Pt does not have significant motor weakness, and MMTs are more limited due to pain in setting of fibromyalgia, chronic pain, and Hx of RCR. Pt has  negative red flag screen, but significant contextual factors affecting prognosis/POC. Pt has current deficits in: cervical spine and shoulder AROM, deltoid/RTC/periscapular strength, postural changes, taut/tender paracervical and periscapular mm, and paracervical pain with associated R upper limb paresthesias. Pt will continue to benefit from skilled PT services to address deficits and improve function.  OBJECTIVE IMPAIRMENTS: decreased ROM, decreased strength, hypomobility, impaired flexibility, impaired UE functional use, postural dysfunction, and pain.   ACTIVITY LIMITATIONS: carrying, lifting, bending, sitting, sleeping, reach over head, and hygiene/grooming  PARTICIPATION LIMITATIONS: cleaning, laundry, and community activity  PERSONAL FACTORS: Past/current experiences, Time since onset of injury/illness/exacerbation, and 3+ comorbidities: (anxiety, depression, osteoporosis, PTSD, fibromyalgia) are also affecting patient's functional outcome.   REHAB POTENTIAL: Good  CLINICAL DECISION MAKING: Unstable/unpredictable  EVALUATION COMPLEXITY: High   GOALS: Goals reviewed with patient? Yes  SHORT TERM GOALS: Target date: 09/30/2024  Pt will be independent with HEP to improve strength and decrease neck pain to improve pain-free function at home and work. Baseline: 09/05/24: Baseline HEP initiated and handout given.  Goal status: INITIAL   LONG TERM GOALS: Target date:  10/27/2024  Pt will have functional cervical spine ROM in all directions without reproduction of upper quarter pain as needed for scanning environment, driving, and completion of overhead tasks Baseline: 09/05/24: Motion loss in all planes, pain with flexion/extension/R lateral flexion/L rotation  Goal status: INITIAL  2.  Pt will decrease worst neck pain by at least 2 points on the NPRS in order to demonstrate clinically significant reduction in neck pain. Baseline: 09/05/24: 10/10 at worst.  Goal status:  INITIAL  3.  Pt will decrease NDI score by at least 5 points or 10% in order demonstrate clinically significant reduction in neck pain/disability.       Baseline: 09/05/24: 32/50 Goal status: INITIAL  4.  Patient will have MMT 4+/5 or greater for all tested upper limb muscle groups indicative of improved strength as needed for lifting/carrying tasks and improved tolerance to load with completion of household/daily activities Baseline: 09/05/24: Shoulder flexion, abduction, ER 4 to 4-/5 with pain reproduction (see chart above).  Goal status: INITIAL   PLAN: PT FREQUENCY: 1-2x/week  PT DURATION: 6 weeks  PLANNED INTERVENTIONS: Therapeutic exercises, Therapeutic activity, Neuromuscular re-education, Balance training, Gait training, Patient/Family education, Self Care, Joint mobilization, Joint manipulation, Vestibular training, Canalith repositioning, Orthotic/Fit training, DME instructions, Dry Needling, Electrical stimulation, Spinal manipulation, Spinal mobilization, Cryotherapy, Moist heat, Taping, Traction, Ultrasound, Ionotophoresis 4mg /ml Dexamethasone , Manual therapy, and Re-evaluation.  PLAN FOR NEXT SESSION: Repeated motion testing. Upper limb tension testing. Manual traction and STM for symptom modulation. Check passive accessories. Integrate pain neuroscience education with PT POC. Progress C-spine ROM as tolerated.    Venetia Endo, PT, DPT #[id number]  Venetia ONEIDA Endo, PT 09/14/2024, 12:09 PM

## 2024-09-16 ENCOUNTER — Encounter: Payer: Self-pay | Admitting: *Deleted

## 2024-09-19 ENCOUNTER — Ambulatory Visit: Admitting: Physical Therapy

## 2024-09-19 ENCOUNTER — Encounter: Payer: Self-pay | Admitting: Anesthesiology

## 2024-09-19 ENCOUNTER — Ambulatory Visit: Admission: RE | Admit: 2024-09-19 | Source: Home / Self Care

## 2024-09-19 SURGERY — COLONOSCOPY
Anesthesia: General

## 2024-09-21 ENCOUNTER — Encounter: Admitting: Physical Therapy

## 2024-09-26 ENCOUNTER — Encounter: Admitting: Physical Therapy

## 2024-09-28 ENCOUNTER — Encounter: Admitting: Physical Therapy

## 2024-10-03 ENCOUNTER — Encounter: Admitting: Physical Therapy

## 2024-10-05 ENCOUNTER — Encounter: Admitting: Physical Therapy

## 2024-10-11 ENCOUNTER — Ambulatory Visit: Admitting: Physical Therapy

## 2024-10-13 ENCOUNTER — Ambulatory Visit: Admitting: Physical Therapy

## 2024-10-19 ENCOUNTER — Ambulatory Visit: Admitting: Physical Therapy

## 2024-10-25 ENCOUNTER — Ambulatory Visit: Admitting: Anesthesiology

## 2024-10-25 ENCOUNTER — Other Ambulatory Visit: Payer: Self-pay

## 2024-10-25 ENCOUNTER — Ambulatory Visit: Admitting: Physical Therapy

## 2024-10-25 ENCOUNTER — Ambulatory Visit
Admission: RE | Admit: 2024-10-25 | Discharge: 2024-10-25 | Disposition: A | Attending: Gastroenterology | Admitting: Gastroenterology

## 2024-10-25 ENCOUNTER — Encounter: Admission: RE | Disposition: A | Payer: Self-pay | Attending: Gastroenterology

## 2024-10-25 DIAGNOSIS — Z800 Family history of malignant neoplasm of digestive organs: Secondary | ICD-10-CM | POA: Diagnosis not present

## 2024-10-25 DIAGNOSIS — K2970 Gastritis, unspecified, without bleeding: Secondary | ICD-10-CM | POA: Diagnosis not present

## 2024-10-25 DIAGNOSIS — F17200 Nicotine dependence, unspecified, uncomplicated: Secondary | ICD-10-CM | POA: Diagnosis not present

## 2024-10-25 DIAGNOSIS — R1310 Dysphagia, unspecified: Secondary | ICD-10-CM | POA: Diagnosis present

## 2024-10-25 DIAGNOSIS — J45909 Unspecified asthma, uncomplicated: Secondary | ICD-10-CM | POA: Insufficient documentation

## 2024-10-25 DIAGNOSIS — Z8719 Personal history of other diseases of the digestive system: Secondary | ICD-10-CM | POA: Diagnosis not present

## 2024-10-25 HISTORY — PX: ESOPHAGOGASTRODUODENOSCOPY: SHX5428

## 2024-10-25 SURGERY — EGD (ESOPHAGOGASTRODUODENOSCOPY)
Anesthesia: General

## 2024-10-25 MED ORDER — PROPOFOL 10 MG/ML IV BOLUS
INTRAVENOUS | Status: DC | PRN
  Administered 2024-10-25: 13:00:00 100 mg via INTRAVENOUS
  Administered 2024-10-25: 13:00:00 20 mg via INTRAVENOUS
  Administered 2024-10-25: 13:00:00 30 mg via INTRAVENOUS

## 2024-10-25 MED ORDER — GLYCOPYRROLATE 0.2 MG/ML IJ SOLN
INTRAMUSCULAR | Status: DC | PRN
  Administered 2024-10-25: 13:00:00 .1 mg via INTRAVENOUS

## 2024-10-25 MED ORDER — SODIUM CHLORIDE 0.9 % IV SOLN: INTRAVENOUS | Status: DC

## 2024-10-25 MED ORDER — LIDOCAINE HCL (CARDIAC) PF 100 MG/5ML IV SOSY
PREFILLED_SYRINGE | INTRAVENOUS | Status: DC | PRN
  Administered 2024-10-25: 13:00:00 100 mg via INTRAVENOUS

## 2024-10-25 NOTE — Anesthesia Postprocedure Evaluation (Signed)
 Anesthesia Post Note  Patient: Julia Hoover  Procedure(s) Performed: EGD (ESOPHAGOGASTRODUODENOSCOPY)  Patient location during evaluation: Endoscopy Anesthesia Type: General Level of consciousness: awake and alert Pain management: pain level controlled Vital Signs Assessment: post-procedure vital signs reviewed and stable Respiratory status: spontaneous breathing, nonlabored ventilation and respiratory function stable Cardiovascular status: blood pressure returned to baseline and stable Postop Assessment: no apparent nausea or vomiting Anesthetic complications: no   No notable events documented.   Last Vitals:  Vitals:   10/25/24 1259 10/25/24 1308  BP: 114/61 117/66  Pulse: 66 64  Resp: 11 14  Temp:    SpO2: 100% 100%    Last Pain:  Vitals:   10/25/24 1308  TempSrc:   PainSc: 0-No pain                 Fairy POUR Azani Brogdon

## 2024-10-25 NOTE — H&P (Signed)
 Outpatient short stay form Pre-procedure 10/25/2024  Ole ONEIDA Schick, MD  Primary Physician: Harvey Gaetana CROME, NP  Reason for visit:  GERD/Dysphagia  History of present illness:    58 y/o lady with chronic pain on opioids here for EGD for dysphagia/GERD. No blood thinners. Uncle had stomach cancer. No neck surgeries.   Current Medications[1]  Medications Prior to Admission  Medication Sig Dispense Refill Last Dose/Taking   amphetamine-dextroamphetamine (ADDERALL XR) 10 MG 24 hr capsule Take 10 mg by mouth daily.   10/24/2024   Calcium Carbonate (CALCIUM 500 PO) Take 2 tablets by mouth daily.   Past Week   carisoprodol (SOMA) 250 MG tablet Take 250 mg by mouth 4 (four) times daily as needed.   10/24/2024   celecoxib (CELEBREX) 100 MG capsule Take 100 mg by mouth 2 (two) times daily.   10/24/2024   Cholecalciferol (VITAMIN D-3) 25 MCG (1000 UT) CAPS Take 1 capsule by mouth in the morning and at bedtime.   Past Week   clonazePAM  (KLONOPIN ) 0.5 MG tablet Take 0.5 mg by mouth 3 (three) times daily as needed for anxiety.   10/24/2024   escitalopram (LEXAPRO) 20 MG tablet Take 20 mg by mouth daily.   10/24/2024   folic acid (FOLVITE) 1 MG tablet Take 1 mg by mouth daily.   Past Week   gabapentin (NEURONTIN) 300 MG capsule Take 600 mg by mouth 3 (three) times daily.   10/24/2024   methotrexate (RHEUMATREX) 2.5 MG tablet Take 15 mg by mouth.   Past Week   oxyCODONE  (OXY IR/ROXICODONE ) 5 MG immediate release tablet SMARTSIG:1 Tablet(s) By Mouth 6 Times Daily PRN   10/25/2024 at  7:00 AM   REXULTI 2 MG TABS tablet Take 2 mg by mouth daily.   10/24/2024   Romosozumab-aqqg (EVENITY) 105 MG/1.17ML SOSY injection Inject 210 mg into the skin.   Past Month   rosuvastatin (CRESTOR) 10 MG tablet Take 10 mg by mouth daily.   10/24/2024   TRELEGY ELLIPTA 100-62.5-25 MCG/ACT AEPB Inhale 1 puff into the lungs daily.   Past Week   albuterol  (PROVENTIL  HFA;VENTOLIN  HFA) 108 (90 Base) MCG/ACT inhaler Inhale  1-2 puffs into the lungs every 6 (six) hours as needed for wheezing or shortness of breath. 1 Inhaler 0    linaclotide (LINZESS) 145 MCG CAPS capsule Take 145 mcg by mouth.      Na Sulfate-K Sulfate-Mg Sulfate concentrate (SUPREP) 17.5-3.13-1.6 GM/177ML SOLN Take 1 Bottle by mouth. (Patient not taking: Reported on 10/25/2024)   Completed Course   pantoprazole (PROTONIX) 40 MG tablet Take 40 mg by mouth daily. (Patient not taking: Reported on 10/25/2024)   Not Taking   traZODone (DESYREL) 150 MG tablet Take 150 mg by mouth at bedtime.      zoledronic acid (RECLAST) 5 MG/100ML SOLN injection Inject 5 mg into the vein once. annually (Patient not taking: No sig reported)        Allergies[2]   Past Medical History:  Diagnosis Date   Alcohol abuse    Anemia    Anxiety    Arthralgia of multiple joints    Arthritis    neck (noi limitations), fingers   Asthma    Bulging lumbar disc    Bursitis of both hips    Constipation    Degenerative joint disease (DJD) of lumbar spine    Depression    Dysmenorrhea    Dysrhythmia    had irreg beat approx 12 years ago after taking ecstasy.  none since.  Fibroids    Fibromyalgia    Greater trochanteric bursitis of both hips    Hepatitis    Hep B - as a child   Menorrhagia    Numbness in both hands    upon awakening, then goes away   Osteoporosis    PTSD (post-traumatic stress disorder)    Rotator cuff tear, right 06/2022   Scoliosis     Review of systems:  Otherwise negative.    Physical Exam  Gen: Alert, oriented. Appears stated age.  HEENT: PERRLA. Lungs: No respiratory distress CV: RRR Abd: soft, benign, no masses Ext: No edema    Planned procedures: Proceed with EGD. The patient understands the nature of the planned procedure, indications, risks, alternatives and potential complications including but not limited to bleeding, infection, perforation, damage to internal organs and possible oversedation/side effects from anesthesia.  The patient agrees and gives consent to proceed.  Please refer to procedure notes for findings, recommendations and patient disposition/instructions.     Ole ONEIDA Schick, MD [facility]         [1]  Current Facility-Administered Medications:    0.9 %  sodium chloride  infusion, , Intravenous, Continuous, Bassam Dresch, Ole ONEIDA, MD, Last Rate: 20 mL/hr at 10/25/24 1126, New Bag at 10/25/24 1126 [2]  Allergies Allergen Reactions   Methocarbamol Diarrhea   Tizanidine Diarrhea, Other (See Comments) and Nausea Only   Tizanidine Hcl Diarrhea    NAUSEA,DIZZINESS   Latex Rash

## 2024-10-25 NOTE — Anesthesia Preprocedure Evaluation (Signed)
 Anesthesia Evaluation  Patient identified by MRN, date of birth, ID band Patient awake    Reviewed: Allergy & Precautions, NPO status , Patient's Chart, lab work & pertinent test results  History of Anesthesia Complications Negative for: history of anesthetic complications  Airway Mallampati: III  TM Distance: <3 FB Neck ROM: full    Dental  (+) Chipped, Poor Dentition   Pulmonary asthma , Current Smoker and Patient abstained from smoking.   Pulmonary exam normal        Cardiovascular Exercise Tolerance: Good Normal cardiovascular exam+ dysrhythmias      Neuro/Psych negative neurological ROS  negative psych ROS   GI/Hepatic negative GI ROS,,,(+) Hepatitis -  Endo/Other  negative endocrine ROS    Renal/GU negative Renal ROS  negative genitourinary   Musculoskeletal   Abdominal   Peds  Hematology negative hematology ROS (+)   Anesthesia Other Findings Past Medical History: No date: Alcohol abuse No date: Anemia No date: Anxiety No date: Arthralgia of multiple joints No date: Arthritis     Comment:  neck (noi limitations), fingers No date: Asthma No date: Bulging lumbar disc No date: Bursitis of both hips No date: Constipation 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              none since. No date: Fibroids No date: Fibromyalgia No date: Greater trochanteric bursitis of both hips No date: Hepatitis     Comment:  Hep B - as a child No date: Menorrhagia No date: Numbness in both hands     Comment:  upon awakening, then goes away No date: Osteoporosis No date: PTSD (post-traumatic stress disorder) 06/2022: Rotator cuff tear, right No date: Scoliosis  Past Surgical History: No date: BREAST BIOPSY; Right     Comment:  neg 2013: BREAST CYST  ASPIRATION; Right     Comment:  neg No date: CHOLECYSTECTOMY No date: KNEE SURGERY; Right     Comment:  thickening of bands No date: NOSE SURGERY     Comment:  fracture as a child 06/20/2022: SHOULDER ARTHROSCOPY WITH ROTATOR CUFF REPAIR AND  SUBACROMIAL DECOMPRESSION; Right     Comment:  Procedure: Right shoulder arthroscopic vs mini-open               rotator cuff repair, biceps tenodesis, distal clavicle               excision, subacromial decompression;  Surgeon: Tobie Priest, MD;  Location: [facility];  Service: Orthopedics;                Laterality: Right; No date: TONSILLECTOMY No date: TUBAL LIGATION 2009: ULNAR COLLATERAL LIGAMENT RECONSTRUCTION; Left  BMI    Body Mass Index: 26.06 kg/m      Reproductive/Obstetrics negative OB ROS                              Anesthesia Physical Anesthesia Plan  ASA: 3  Anesthesia Plan: General   Post-op Pain Management:    Induction: Intravenous  PONV Risk Score and Plan: Propofol  infusion and TIVA  Airway Management Planned: Natural Airway and Nasal Cannula  Additional Equipment:   Intra-op Plan:   Post-operative Plan:   Informed Consent: I have  reviewed the patients History and Physical, chart, labs and discussed the procedure including the risks, benefits and alternatives for the proposed anesthesia with the patient or authorized representative who has indicated his/her understanding and acceptance.     Dental Advisory Given  Plan Discussed with: Anesthesiologist, CRNA and Surgeon  Anesthesia Plan Comments: (Patient consented for risks of anesthesia including but not limited to:  - adverse reactions to medications - risk of airway placement if required - damage to eyes, teeth, lips or other oral mucosa - nerve damage due to positioning  - sore throat or hoarseness - Damage to heart, brain, nerves, lungs, other parts of body or loss of life  Patient voiced understanding  and assent.)        Anesthesia Quick Evaluation

## 2024-10-25 NOTE — Transfer of Care (Signed)
 Immediate Anesthesia Transfer of Care Note  Patient: Julia Hoover  Procedure(s) Performed: EGD (ESOPHAGOGASTRODUODENOSCOPY)  Patient Location: PACU  Anesthesia Type:General  Level of Consciousness: awake, alert , oriented, and patient cooperative  Airway & Oxygen Therapy: Patient Spontanous Breathing  Post-op Assessment: Report given to RN, Post -op Vital signs reviewed and stable, and Patient moving all extremities X 4  Post vital signs: Reviewed and stable  Last Vitals:  Vitals Value Taken Time  BP 105/65 10/25/24 12:49  Temp 36.4 C 10/25/24 12:49  Pulse 79 10/25/24 12:49  Resp 13 10/25/24 12:49  SpO2 100 % 10/25/24 12:49    Last Pain:  Vitals:   10/25/24 1249  TempSrc: Tympanic  PainSc: 0-No pain         Complications: No notable events documented.

## 2024-10-25 NOTE — Interval H&P Note (Signed)
 History and Physical Interval Note:  10/25/2024 12:28 PM  Julia Hoover  has presented today for surgery, with the diagnosis of Gastroesophageal reflux disease, unspecified whether esophagitis present (K21.9).  The various methods of treatment have been discussed with the patient and family. After consideration of risks, benefits and other options for treatment, the patient has consented to  Procedures: EGD (ESOPHAGOGASTRODUODENOSCOPY) (N/A) as a surgical intervention.  The patient's history has been reviewed, patient examined, no change in status, stable for surgery.  I have reviewed the patient's chart and labs.  Questions were answered to the patient's satisfaction.     Ole ONEIDA Schick  Ok to proceed with EGD

## 2024-10-25 NOTE — Op Note (Signed)
 [facility]astroenter[facility] Patient Name: Julia Hoover Procedure Date: 10/25/2024 11:48 AM MRN: [id number] Account #: [id number] Date of Birth: 08/23/1966 Admit Type: Outpatient Age: 58 Room: [facility] ENDO ROOM 1 Gender: Female Note Status: Finalized Instrument Name: Barnie GI Scope [id number] Procedure:             Upper GI endoscopy Indications:           Dysphagia, Gastro-esophageal reflux disease Providers:             Ole Schick MD, MD Referring MD:          Gaetana CROME. Fields (Referring MD) Medicines:             Monitored Anesthesia Care Complications:         No immediate complications. Estimated blood loss:                         Minimal. Procedure:             Pre-Anesthesia Assessment:                        - Prior to the procedure, a History and Physical was                         performed, and patient medications and allergies were                         reviewed. The patient is competent. The risks and                         benefits of the procedure and the sedation options and                         risks were discussed with the patient. All questions                         were answered and informed consent was obtained.                         Patient identification and proposed procedure were                         verified by the physician, the nurse, the                         anesthesiologist, the anesthetist and the technician                         in the endoscopy suite. Mental Status Examination:                         alert and oriented. Airway Examination: normal                         oropharyngeal airway and neck mobility. Respiratory                         Examination: clear to auscultation. CV Examination:  normal. Prophylactic Antibiotics: The patient does not                         require prophylactic antibiotics. Prior                         Anticoagulants: The patient has taken no  anticoagulant                         or antiplatelet agents. ASA Grade Assessment: III - A                         patient with severe systemic disease. After reviewing                         the risks and benefits, the patient was deemed in                         satisfactory condition to undergo the procedure. The                         anesthesia plan was to use monitored anesthesia care                         (MAC). Immediately prior to administration of                         medications, the patient was re-assessed for adequacy                         to receive sedatives. The heart rate, respiratory                         rate, oxygen saturations, blood pressure, adequacy of                         pulmonary ventilation, and response to care were                         monitored throughout the procedure. The physical                         status of the patient was re-assessed after the                         procedure.                        After obtaining informed consent, the endoscope was                         passed under direct vision. Throughout the procedure,                         the patient's blood pressure, pulse, and oxygen                         saturations were monitored continuously. The Endoscope  was introduced through the mouth, and advanced to the                         second part of duodenum. The upper GI endoscopy was                         accomplished without difficulty. The patient tolerated                         the procedure well. Findings:      The examined esophagus was normal.      Patchy mild inflammation characterized by erythema was found in the       gastric antrum. Biopsies were taken with a cold forceps for Helicobacter       pylori testing. Estimated blood loss was minimal.      The examined duodenum was normal. Impression:            - Normal esophagus.                        - Gastritis. Biopsied.                         - Normal examined duodenum. Recommendation:        - Discharge patient to home.                        - Resume previous diet.                        - Continue present medications.                        - Await pathology results.                        - Return to referring physician as previously                         scheduled. If continued issues with swallowing then                         could perform barium swallows. Suspect opioids as the                         cause of the dysphagia. Procedure Code(s):     --- Professional ---                        [id number], Esophagogastroduodenoscopy, flexible,                         transoral; with biopsy, single or multiple Diagnosis Code(s):     --- Professional ---                        K29.70, Gastritis, unspecified, without bleeding                        R13.10, Dysphagia, unspecified  K21.9, Gastro-esophageal reflux disease without                         esophagitis CPT copyright 2022 [employer]. All rights reserved. The codes documented in this report are preliminary and upon coder review may  be revised to meet current compliance requirements. Ole Schick MD, MD 10/25/2024 12:49:34 PM Number of Addenda: 0 Note Initiated On: 10/25/2024 11:48 AM Estimated Blood Loss:  Estimated blood loss was minimal.      [facility]

## 2024-10-26 LAB — SURGICAL PATHOLOGY

## 2024-10-27 ENCOUNTER — Ambulatory Visit: Admitting: Physical Therapy

## 2024-11-01 ENCOUNTER — Ambulatory Visit: Admitting: Physical Therapy

## 2024-11-08 ENCOUNTER — Ambulatory Visit: Admitting: Physical Therapy

## 2024-11-15 ENCOUNTER — Ambulatory Visit: Admitting: Physical Therapy

## 2024-11-17 ENCOUNTER — Ambulatory Visit: Admitting: Physical Therapy

## 2024-11-22 ENCOUNTER — Ambulatory Visit: Admitting: Physical Therapy

## 2024-11-24 ENCOUNTER — Ambulatory Visit: Admitting: Physical Therapy
